# Patient Record
Sex: Male | Born: 1975
Health system: Southern US, Community
[De-identification: ages and names within clinical notes are randomized; demographics above are authoritative.]

## PROBLEM LIST (undated history)

## (undated) DIAGNOSIS — E119 Type 2 diabetes mellitus without complications: Secondary | ICD-10-CM

## (undated) DIAGNOSIS — E7800 Pure hypercholesterolemia, unspecified: Secondary | ICD-10-CM

## (undated) DIAGNOSIS — K219 Gastro-esophageal reflux disease without esophagitis: Secondary | ICD-10-CM

## (undated) DIAGNOSIS — I2699 Other pulmonary embolism without acute cor pulmonale: Secondary | ICD-10-CM

## (undated) DIAGNOSIS — I82409 Acute embolism and thrombosis of unspecified deep veins of unspecified lower extremity: Secondary | ICD-10-CM

## (undated) DIAGNOSIS — A0472 Enterocolitis due to Clostridium difficile, not specified as recurrent: Secondary | ICD-10-CM

## (undated) DIAGNOSIS — I10 Essential (primary) hypertension: Secondary | ICD-10-CM

## (undated) DIAGNOSIS — K859 Acute pancreatitis without necrosis or infection, unspecified: Secondary | ICD-10-CM

## (undated) HISTORY — PX: URETHRA SURGERY: SHX824

## (undated) HISTORY — PX: CHOLECYSTECTOMY: SHX55

---

## 2004-08-18 ENCOUNTER — Emergency Department (HOSPITAL_COMMUNITY): Admission: EM | Admit: 2004-08-18 | Discharge: 2004-08-19 | Payer: Self-pay | Admitting: Emergency Medicine

## 2007-10-02 ENCOUNTER — Ambulatory Visit: Payer: Self-pay | Admitting: Occupational Medicine

## 2008-01-28 ENCOUNTER — Ambulatory Visit: Payer: Self-pay | Admitting: Family Medicine

## 2008-01-28 DIAGNOSIS — J029 Acute pharyngitis, unspecified: Secondary | ICD-10-CM

## 2008-01-28 DIAGNOSIS — K219 Gastro-esophageal reflux disease without esophagitis: Secondary | ICD-10-CM

## 2008-01-28 DIAGNOSIS — J069 Acute upper respiratory infection, unspecified: Secondary | ICD-10-CM | POA: Insufficient documentation

## 2008-01-28 LAB — CONVERTED CEMR LAB: Rapid Strep: NEGATIVE

## 2008-02-21 ENCOUNTER — Ambulatory Visit: Payer: Self-pay | Admitting: Family Medicine

## 2008-02-21 DIAGNOSIS — R197 Diarrhea, unspecified: Secondary | ICD-10-CM

## 2008-02-21 DIAGNOSIS — R110 Nausea: Secondary | ICD-10-CM | POA: Insufficient documentation

## 2010-03-12 ENCOUNTER — Emergency Department (HOSPITAL_COMMUNITY)
Admission: EM | Admit: 2010-03-12 | Discharge: 2010-03-13 | Disposition: A | Discharge status: Home / Self Care | Payer: BC Managed Care – PPO | Attending: Emergency Medicine | Admitting: Emergency Medicine

## 2010-03-12 DIAGNOSIS — R05 Cough: Secondary | ICD-10-CM | POA: Insufficient documentation

## 2010-03-12 DIAGNOSIS — R059 Cough, unspecified: Secondary | ICD-10-CM | POA: Insufficient documentation

## 2010-03-12 DIAGNOSIS — R5383 Other fatigue: Secondary | ICD-10-CM | POA: Insufficient documentation

## 2010-03-12 DIAGNOSIS — J40 Bronchitis, not specified as acute or chronic: Secondary | ICD-10-CM | POA: Insufficient documentation

## 2010-03-12 DIAGNOSIS — R112 Nausea with vomiting, unspecified: Secondary | ICD-10-CM | POA: Insufficient documentation

## 2010-03-12 DIAGNOSIS — R51 Headache: Secondary | ICD-10-CM | POA: Insufficient documentation

## 2010-03-12 DIAGNOSIS — H9209 Otalgia, unspecified ear: Secondary | ICD-10-CM | POA: Insufficient documentation

## 2010-03-12 DIAGNOSIS — R6883 Chills (without fever): Secondary | ICD-10-CM | POA: Insufficient documentation

## 2010-03-12 DIAGNOSIS — R079 Chest pain, unspecified: Secondary | ICD-10-CM | POA: Insufficient documentation

## 2010-03-12 DIAGNOSIS — J3489 Other specified disorders of nose and nasal sinuses: Secondary | ICD-10-CM | POA: Insufficient documentation

## 2010-03-12 DIAGNOSIS — R5381 Other malaise: Secondary | ICD-10-CM | POA: Insufficient documentation

## 2010-03-12 LAB — RAPID STREP SCREEN (MED CTR MEBANE ONLY): Streptococcus, Group A Screen (Direct): NEGATIVE

## 2018-02-22 ENCOUNTER — Emergency Department (HOSPITAL_BASED_OUTPATIENT_CLINIC_OR_DEPARTMENT_OTHER): Payer: BLUE CROSS/BLUE SHIELD

## 2018-02-22 ENCOUNTER — Other Ambulatory Visit: Payer: Self-pay

## 2018-02-22 ENCOUNTER — Inpatient Hospital Stay (HOSPITAL_BASED_OUTPATIENT_CLINIC_OR_DEPARTMENT_OTHER)
Admission: EM | Admit: 2018-02-22 | Discharge: 2018-02-24 | DRG: 176 | Disposition: A | Discharge status: Home / Self Care | Payer: BLUE CROSS/BLUE SHIELD | Attending: Family Medicine | Admitting: Family Medicine

## 2018-02-22 ENCOUNTER — Encounter (HOSPITAL_BASED_OUTPATIENT_CLINIC_OR_DEPARTMENT_OTHER): Payer: Self-pay

## 2018-02-22 DIAGNOSIS — I2699 Other pulmonary embolism without acute cor pulmonale: Secondary | ICD-10-CM | POA: Diagnosis not present

## 2018-02-22 DIAGNOSIS — M79604 Pain in right leg: Secondary | ICD-10-CM

## 2018-02-22 DIAGNOSIS — Z8711 Personal history of peptic ulcer disease: Secondary | ICD-10-CM

## 2018-02-22 DIAGNOSIS — I82409 Acute embolism and thrombosis of unspecified deep veins of unspecified lower extremity: Secondary | ICD-10-CM | POA: Diagnosis present

## 2018-02-22 DIAGNOSIS — Z880 Allergy status to penicillin: Secondary | ICD-10-CM

## 2018-02-22 DIAGNOSIS — K219 Gastro-esophageal reflux disease without esophagitis: Secondary | ICD-10-CM | POA: Diagnosis present

## 2018-02-22 DIAGNOSIS — I82401 Acute embolism and thrombosis of unspecified deep veins of right lower extremity: Secondary | ICD-10-CM | POA: Diagnosis present

## 2018-02-22 DIAGNOSIS — R0683 Snoring: Secondary | ICD-10-CM | POA: Diagnosis not present

## 2018-02-22 LAB — BASIC METABOLIC PANEL
Anion gap: 8 (ref 5–15)
BUN: 18 mg/dL (ref 6–20)
CO2: 26 mmol/L (ref 22–32)
Calcium: 9.4 mg/dL (ref 8.9–10.3)
Chloride: 102 mmol/L (ref 98–111)
Creatinine, Ser: 1.16 mg/dL (ref 0.61–1.24)
GFR calc Af Amer: >60 mL/min (ref >60)
GFR calc non Af Amer: >60 mL/min (ref >60)
Glucose, Bld: 109 mg/dL — ABNORMAL HIGH (ref 70–99)
POTASSIUM: 4 mmol/L (ref 3.5–5.1)
Sodium: 136 mmol/L (ref 135–145)

## 2018-02-22 LAB — CBC WITH DIFFERENTIAL/PLATELET
Abs Immature Granulocytes: 0.03 10*3/uL (ref 0.00–0.07)
BASOS PCT: 1 %
Basophils Absolute: 0.1 10*3/uL (ref 0.0–0.1)
Eosinophils Absolute: 0.2 10*3/uL (ref 0.0–0.5)
Eosinophils Relative: 3 %
HCT: 44.3 % (ref 39.0–52.0)
Hemoglobin: 14.4 g/dL (ref 13.0–17.0)
Immature Granulocytes: 1 %
Lymphocytes Relative: 30 %
Lymphs Abs: 1.9 10*3/uL (ref 0.7–4.0)
MCH: 29.1 pg (ref 26.0–34.0)
MCHC: 32.5 g/dL (ref 30.0–36.0)
MCV: 89.5 fL (ref 80.0–100.0)
Monocytes Absolute: 0.6 10*3/uL (ref 0.1–1.0)
Monocytes Relative: 10 %
Neutro Abs: 3.7 10*3/uL (ref 1.7–7.7)
Neutrophils Relative %: 55 %
PLATELETS: 211 10*3/uL (ref 150–400)
RBC: 4.95 MIL/uL (ref 4.22–5.81)
RDW: 12.3 % (ref 11.5–15.5)
WBC: 6.5 10*3/uL (ref 4.0–10.5)
nRBC: 0 % (ref 0.0–0.2)

## 2018-02-22 LAB — D-DIMER, QUANTITATIVE: D-Dimer, Quant: 2.24 ug/mL-FEU — ABNORMAL HIGH (ref 0.00–0.50)

## 2018-02-22 LAB — TROPONIN I: Troponin I: <0.03 ng/mL (ref <0.03)

## 2018-02-22 LAB — GROUP A STREP BY PCR: GROUP A STREP BY PCR: NOT DETECTED

## 2018-02-22 MED ORDER — KETOROLAC TROMETHAMINE 30 MG/ML IJ SOLN
30.0000 mg | Freq: Once | INTRAMUSCULAR | Status: AC
  Administered 2018-02-22: 30 mg via INTRAVENOUS
  Filled 2018-02-22: qty 1

## 2018-02-22 MED ORDER — HEPARIN (PORCINE) 25000 UT/250ML-% IV SOLN
1700.0000 [IU]/h | INTRAVENOUS | Status: AC
  Administered 2018-02-22 – 2018-02-23 (×2): 1700 [IU]/h via INTRAVENOUS
  Filled 2018-02-22 (×2): qty 250

## 2018-02-22 MED ORDER — IOPAMIDOL (ISOVUE-370) INJECTION 76%
100.0000 mL | Freq: Once | INTRAVENOUS | Status: AC | PRN
  Administered 2018-02-22: 100 mL via INTRAVENOUS

## 2018-02-22 MED ORDER — HEPARIN BOLUS VIA INFUSION
5000.0000 [IU] | Freq: Once | INTRAVENOUS | Status: AC
  Administered 2018-02-22: 5000 [IU] via INTRAVENOUS

## 2018-02-22 NOTE — ED Provider Notes (Signed)
[facility] EMERGENCY DEPARTMENT Provider Note   CSN: [id number] Arrival date & time: 02/22/18  1726     History   Chief Complaint Chief Complaint  Patient presents with  . Cough    HPI Dakota Compton is a 43 y.o. male status post urethral surgery on 01/30/18 who presents for evaluation of 6 days of shortness of breath and chest pain.  He also reports he has had sore throat for the last 2 days.  Patient states that for the last 6 days, he has noticed he has had to "breathe heavier than normal."  Dates when he is at rest, he feels like he has to work to get a good deep breath in.  He states that he has noticed that his shortness of breath is worse on exertion.  He states that he tried to go wash his car and states that he had to stop halfway between because he could not breathe.  He also reports he has had some intermittent chest pain.  He states that midsternal chest pain is slightly worse with deep inspiration.  No exertional chest pain.  He denies any pressure, associated diaphoresis, nausea/vomiting.  Patient also reports that he feels like his shortness of breath is worse at night.  He states that at night, when he lays down he feels like he has burning pain that causes him to wake up.  Additionally, he feels like he cannot breathe.  He also reports he has had some mild cough that is worse at night.  He does report a history of GERD and states that he takes medication but takes it in the morning.  Patient also reports that about 2 days ago, he started developing a sore throat.  He states it feels similar to when he was extubated.  He states he has been able to tolerate his secretions and p.o. without any difficulty.  Patient states that he has not had any fevers, congestion, rhinorrhea, nausea/vomiting.  He does report that 2 members of his household are sick with flu.  The history is provided by the patient.    History reviewed. No pertinent past medical history.  Patient  Active Problem List   Diagnosis Date Noted  . NAUSEA ALONE 02/21/2008  . DIARRHEA 02/21/2008  . ACUTE PHARYNGITIS 01/28/2008  . URI 01/28/2008  . GERD 01/28/2008    Past Surgical History:  Procedure Laterality Date  . URETHRA SURGERY          Home Medications    Prior to Admission medications   Not on File    Family History No family history on file.  Social History Social History   Tobacco Use  . Smoking status: Never Smoker  . Smokeless tobacco: Never Used  Substance Use Topics  . Alcohol use: Never    Frequency: Never  . Drug use: Never     Allergies   Penicillins   Review of Systems Review of Systems  Constitutional: Negative for fever.  HENT: Positive for sore throat. Negative for rhinorrhea.   Respiratory: Positive for cough and shortness of breath.   Cardiovascular: Positive for chest pain. Negative for leg swelling.  Gastrointestinal: Negative for abdominal pain, nausea and vomiting.  Genitourinary: Negative for dysuria and hematuria.  Neurological: Negative for headaches.  All other systems reviewed and are negative.    Physical Exam Updated Vital Signs BP 127/84   Pulse 81   Temp 97.9 F (36.6 C) (Oral)   Resp 17   Ht 6'  4" (1.93 m)   Wt 122.5 kg   SpO2 97%   BMI 32.87 kg/m   Physical Exam Vitals signs and nursing note reviewed.  Constitutional:      Appearance: Normal appearance. He is well-developed.  HENT:     Head: Normocephalic and atraumatic.     Mouth/Throat:     Pharynx: Posterior oropharyngeal erythema present.     Comments: Steer oropharynx is erythematous.  No exudates, edema.  Uvula is midline. Airway is patent, phonation is intact. Eyes:     General: Lids are normal.     Conjunctiva/sclera: Conjunctivae normal.     Pupils: Pupils are equal, round, and reactive to light.  Neck:     Musculoskeletal: Full passive range of motion without pain.  Cardiovascular:     Rate and Rhythm: Normal rate and regular rhythm.       Pulses: Normal pulses.     Heart sounds: Normal heart sounds. No murmur. No friction rub. No gallop.   Pulmonary:     Effort: Pulmonary effort is normal.     Breath sounds: Normal breath sounds.     Comments: Lungs clear to auscultation bilaterally.  Symmetric chest rise.  No wheezing, rales, rhonchi.  Will speak in full sentences without any difficulty. Chest:     Chest wall: Tenderness present.     Comments: Tenderness to palpation noted to midsternal chest area.  No deformity or crepitus noted. Abdominal:     Palpations: Abdomen is soft. Abdomen is not rigid.     Tenderness: There is no abdominal tenderness. There is no guarding.  Musculoskeletal: Normal range of motion.     Comments: Tenderness palpation in his right calf.  Right calf is slightly dry than left lower extremity.  No overlying warmth, erythema.  Skin:    General: Skin is warm and dry.     Capillary Refill: Capillary refill takes less than 2 seconds.  Neurological:     Mental Status: He is alert and oriented to person, place, and time.  Psychiatric:        Speech: Speech normal.      ED Treatments / Results  Labs (all labs ordered are listed, but only abnormal results are displayed) Labs Reviewed  D-DIMER, QUANTITATIVE (NOT AT [facility]) - Abnormal; Notable for the following components:      Result Value   D-Dimer, Quant 2.24 (*)    All other components within normal limits  BASIC METABOLIC PANEL - Abnormal; Notable for the following components:   Glucose, Bld 109 (*)    All other components within normal limits  GROUP A STREP BY PCR  TROPONIN I  CBC WITH DIFFERENTIAL/PLATELET  INFLUENZA PANEL BY PCR (TYPE A & B)    EKG None  Radiology Dg Chest 2 View  Result Date: 02/22/2018 CLINICAL DATA:  Shortness of breath EXAM: CHEST - 2 VIEW COMPARISON:  None. FINDINGS: The heart size and mediastinal contours are within normal limits. Both lungs are clear. The visualized skeletal structures are unremarkable.  IMPRESSION: No active cardiopulmonary disease. Electronically Signed   By: Deatra Robinson M.D.   On: 02/22/2018 19:17    Procedures Procedures (including critical care time)  Medications Ordered in ED Medications  iopamidol (ISOVUE-370) 76 % injection 100 mL (has no administration in time range)  ketorolac (TORADOL) 30 MG/ML injection 30 mg (30 mg Intravenous Given 02/22/18 1956)     Initial Impression / Assessment and Plan / ED Course  I have reviewed the triage vital signs  and the nursing notes.  Pertinent labs & imaging results that were available during my care of the patient were reviewed by me and considered in my medical decision making (see chart for details).     43 year old male who presents for evaluation of 6 days of dyspnea on exertion, pleuritic chest pain.  He reports he is status post urological surgery on 01/30/18.  He does report that family members at home have been sick with influenza symptoms but he denies any fevers, nasal congestion.  He does report he has had some sore throat x2 days.  Also endorsing some right calf pain.  Patient is afebrile, non-toxic appearing, sitting comfortably on examination table. Vital signs reviewed and stable.  On exam, lungs clear to auscultation.  No evidence of wheezing.  Patient able speak in full sentences without difficulty.  Right lower extremity slightly larger than left lower extremity but no overlying warmth, erythema, edema.  Given risk factors, will plan for d-dimer evaluation for possible PE.  Additionally, consider ACS etiology though it sounds like atypical presentation.  Consider infectious etiology.  We will plan to check labs, chest x-ray, EKG.  CBC shows no evidence of leukocytosis or anemia.  BMP is unremarkable.  Troponin negative.  Strep is negative.  Chest x-ray negative for any acute infectious etiology.  D-dimer is positive at 2.24.  We will plan to proceed with CTA.  Signed out to Tonny BollmanAbby Harris, PA-C CTA, ultrasound of  right lower extremity.  Final Clinical Impressions(s) / ED Diagnoses   Final diagnoses:  None    ED Discharge Orders    None       Rosana HoesLayden, Nessie Nong A, PA-C 02/22/18 1959    Charlynne PanderYao, David Hsienta, MD 02/24/18 [phone]

## 2018-02-22 NOTE — ED Notes (Signed)
Called pharmacy to verify heparin dosing

## 2018-02-22 NOTE — ED Triage Notes (Signed)
Pt c/o flu like sx with SOB-states he had urologic surgery 01/30/18 and was intubated-NAD-steady gait

## 2018-02-22 NOTE — ED Provider Notes (Signed)
Assumed care of the patient from PA Layden Here for eDOE and pleuritic cp s/p surgery in January ? pe versus Gerd. CTA  Ordered.  CT angios resulted with bilateral pulmonary emboli.  His right lower extremity also shows acute DVT.  Patient started on heparin VTE treatment.  He will be admitted to the hospital.  Stable throughout his ER stay.  CRITICAL CARE Performed by: Arthor Captain Total critical care time: 40 minutes Critical care time was exclusive of separately billable procedures and treating other patients. Critical care was necessary to treat or prevent imminent or life-threatening deterioration. Critical care was time spent personally by me on the following activities: development of treatment plan with patient and/or surrogate as well as nursing, discussions with consultants, evaluation of patient's response to treatment, examination of patient, obtaining history from patient or surrogate, ordering and performing treatments and interventions, ordering and review of laboratory studies, ordering and review of radiographic studies, pulse oximetry and re-evaluation of patient's condition.    Arthor Captain, PA-C 02/24/18 0008    Charlynne Pander, MD 02/24/18 [phone]

## 2018-02-22 NOTE — ED Notes (Signed)
Pt returned from imaging.

## 2018-02-22 NOTE — ED Notes (Addendum)
Pt informed nurse that his right calf has been hurting a lot recently, and that since his surgery, he developed varicose veins on his knee. EDP made aware

## 2018-02-22 NOTE — Progress Notes (Signed)
ANTICOAGULATION CONSULT NOTE - Initial Consult  Pharmacy Consult for heparin Indication: pulmonary embolus  Patient Measurements: Height: 6\' 4"  (193 cm) Weight: 270 lb (122.5 kg) IBW/kg (Calculated) : 86.8 Heparin Dosing Weight: 112.7 kg  Vital Signs: Temp: 97.9 F (36.6 C) (01/30 1740) Temp Source: Oral (01/30 1740) BP: 133/92 (01/30 2135) Pulse Rate: 75 (01/30 2135)  Labs: Recent Labs    02/22/18 1827  HGB 14.4  HCT 44.3  PLT 211  CREATININE 1.16  TROPONINI <0.03    Assessment: 43 yo male with SOB and chest pain. Had surgery earlier this month. CTA today shows bilateral PE with RV/LV ratio < 1. Planning to initiate heparin infusion. CBC wnl, SCr 1.1.    Goal of Therapy:  Heparin level 0.3-0.7 units/ml Monitor platelets by anticoagulation protocol: Yes    Plan:  -Heparin 5000 units x1 then 1700 units/hr -Daily HL, CBC -First level tomorrow morning   Baldemar Friday 02/22/2018,9:56 PM

## 2018-02-23 ENCOUNTER — Encounter (HOSPITAL_COMMUNITY): Payer: Self-pay

## 2018-02-23 ENCOUNTER — Observation Stay (HOSPITAL_COMMUNITY): Payer: BLUE CROSS/BLUE SHIELD

## 2018-02-23 DIAGNOSIS — I2699 Other pulmonary embolism without acute cor pulmonale: Secondary | ICD-10-CM | POA: Diagnosis not present

## 2018-02-23 DIAGNOSIS — I82409 Acute embolism and thrombosis of unspecified deep veins of unspecified lower extremity: Secondary | ICD-10-CM | POA: Diagnosis present

## 2018-02-23 DIAGNOSIS — M79604 Pain in right leg: Secondary | ICD-10-CM | POA: Diagnosis present

## 2018-02-23 DIAGNOSIS — R0683 Snoring: Secondary | ICD-10-CM | POA: Diagnosis not present

## 2018-02-23 DIAGNOSIS — Z880 Allergy status to penicillin: Secondary | ICD-10-CM | POA: Diagnosis not present

## 2018-02-23 DIAGNOSIS — I82411 Acute embolism and thrombosis of right femoral vein: Secondary | ICD-10-CM | POA: Diagnosis not present

## 2018-02-23 DIAGNOSIS — K219 Gastro-esophageal reflux disease without esophagitis: Secondary | ICD-10-CM | POA: Diagnosis not present

## 2018-02-23 DIAGNOSIS — Z8711 Personal history of peptic ulcer disease: Secondary | ICD-10-CM | POA: Diagnosis not present

## 2018-02-23 DIAGNOSIS — I82401 Acute embolism and thrombosis of unspecified deep veins of right lower extremity: Secondary | ICD-10-CM | POA: Diagnosis not present

## 2018-02-23 LAB — ANTITHROMBIN III: AntiThromb III Func: 93 % (ref 75–120)

## 2018-02-23 LAB — CBC
HCT: 42.9 % (ref 39.0–52.0)
Hemoglobin: 13.8 g/dL (ref 13.0–17.0)
MCH: 28.4 pg (ref 26.0–34.0)
MCHC: 32.2 g/dL (ref 30.0–36.0)
MCV: 88.3 fL (ref 80.0–100.0)
PLATELETS: 192 10*3/uL (ref 150–400)
RBC: 4.86 MIL/uL (ref 4.22–5.81)
RDW: 12.3 % (ref 11.5–15.5)
WBC: 6.4 10*3/uL (ref 4.0–10.5)
nRBC: 0 % (ref 0.0–0.2)

## 2018-02-23 LAB — INFLUENZA PANEL BY PCR (TYPE A & B)
INFLAPCR: NEGATIVE
Influenza B By PCR: NEGATIVE

## 2018-02-23 LAB — TROPONIN I
Troponin I: <0.03 ng/mL (ref <0.03)
Troponin I: <0.03 ng/mL (ref <0.03)

## 2018-02-23 LAB — ECHOCARDIOGRAM COMPLETE
Height: 76 in
WEIGHTICAEL: 4172.87 [oz_av]

## 2018-02-23 LAB — HEPARIN LEVEL (UNFRACTIONATED)
Heparin Unfractionated: 0.41 IU/mL (ref 0.30–0.70)
Heparin Unfractionated: 0.44 IU/mL (ref 0.30–0.70)

## 2018-02-23 MED ORDER — PHENOL 1.4 % MT LIQD: 1.0000 | OROMUCOSAL | Status: DC | PRN

## 2018-02-23 MED ORDER — RIVAROXABAN 15 MG PO TABS
15.0000 mg | ORAL_TABLET | Freq: Two times a day (BID) | ORAL | Status: DC
  Administered 2018-02-23 – 2018-02-24 (×2): 15 mg via ORAL
  Filled 2018-02-23 (×2): qty 1

## 2018-02-23 MED ORDER — RIVAROXABAN 15 MG PO TABS: 15.0000 mg | ORAL_TABLET | Freq: Two times a day (BID) | ORAL | Status: DC

## 2018-02-23 MED ORDER — ACETAMINOPHEN 325 MG PO TABS
650.0000 mg | ORAL_TABLET | Freq: Four times a day (QID) | ORAL | Status: DC | PRN
  Administered 2018-02-23: 650 mg via ORAL
  Filled 2018-02-23: qty 2

## 2018-02-23 MED ORDER — SODIUM CHLORIDE 0.9% FLUSH: 3.0000 mL | INTRAVENOUS | Status: DC | PRN

## 2018-02-23 MED ORDER — SODIUM CHLORIDE 0.9% FLUSH
3.0000 mL | Freq: Two times a day (BID) | INTRAVENOUS | Status: DC
  Administered 2018-02-23 (×2): 3 mL via INTRAVENOUS

## 2018-02-23 MED ORDER — GUAIFENESIN-DM 100-10 MG/5ML PO SYRP
5.0000 mL | ORAL_SOLUTION | ORAL | Status: DC | PRN
  Administered 2018-02-23 (×2): 5 mL via ORAL
  Filled 2018-02-23 (×2): qty 5

## 2018-02-23 MED ORDER — ACETAMINOPHEN 650 MG RE SUPP: 650.0000 mg | Freq: Four times a day (QID) | RECTAL | Status: DC | PRN

## 2018-02-23 MED ORDER — RIVAROXABAN (XARELTO) VTE STARTER PACK (15 & 20 MG): ORAL_TABLET | ORAL | 0 refills | Status: DC

## 2018-02-23 MED ORDER — SODIUM CHLORIDE 0.9 % IV SOLN: 250.0000 mL | INTRAVENOUS | Status: DC | PRN

## 2018-02-23 MED ORDER — MENTHOL 3 MG MT LOZG
1.0000 | LOZENGE | OROMUCOSAL | Status: DC | PRN
  Administered 2018-02-23: 3 mg via ORAL
  Filled 2018-02-23: qty 9

## 2018-02-23 MED FILL — XARELTO STARTER PACK: 15 & 20 | 30 days supply | Qty: 51 | Fill #0

## 2018-02-23 NOTE — Progress Notes (Signed)
PROGRESS NOTE  LORRIN PINSKY TRV:[id number] DOB: 10/27/1975 DOA: 02/22/2018 [facility]CP: [facility]  HPI/Brief Narrative  Dakota Compton is a 43 y.o. year old male with medical history significant for GERD (on Protonix) who presented on 02/22/2018 with SOB and right leg cramping and was admitted for further evaluation of RLE DVT and bilateral PE. 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 He states for the first five days after the procedure he was not very mobile while recovering, mostly laying in bed all day. He then was doing fine, but started noticing some pain/tightness in his right calf when he was returned to his urologist on 1/21 to have his catheter removed. He then noticed he started experiencing SOB about 1 week ago, which occurs sometimes at rest and worsens with activity. He reports associated chest burning/tightness that occurs occasionally when he experiences SOB. He denies any fever or hemoptysis. He denies any hx of DVT or PE.   ED course: The patient vitals were stable in the ED. He did not require any O2. CBC did not show any evidence of leukocytosis or anemia. D-dimer was found to be elevated. CTA chest was then ordered, which demonstrated bilateral lower lobe pulmonary emboli with moderate clot burden and slight flattening on LV that suggests mild right heart strain. Doppler US of RLE demonstrated partially occlusive thrombus noted in the distal right femoral vein. He was started on Heparin drip.   Subjective Patient states he is still experiencing SOB today that is about the same as yesterday. He also is still experiencing a sore throat. He denies any CP or palpitations. He denies any nausea, vomiting, or abdominal pain. He denies any dysuria or hematuria.  Assessment/Plan:  1. Acute Pulmonary Embolism- CTA chest demonstrated bilateral lower lobe pulmonary emboli. Likely due to recent urethroplasty on 1/7 along with sedentary lifestyle  initially while recovering after the procedure. Patient has been started on Heparin drip. His Troponin has been negative x 2. Will continue to trend. His CTA chest did demonstrate evidence of possible right heart strain. Echo was performed today demonstrated normal systolic function of 60-65% and normal diastolic filling patterns. A Hypercoaguable work up has been drawn, but drawn after Heparin was already started. His vitals continue to be stable today. No tachycardia or hypotension. His O2 sat is stable on room air. Will continue Heparin drip with the plan to transition to PO anticoagulation later today or tomorrow.   2. DVT of RLE- Patient has been started on Heparin drip with plan to likely transition to PO anticoagulation later today or tomorrow. See #1 for details.   3. GERD w/ hx of gastric ulcers- Stable. Patient states he takes Protonix for his GERD. He does have hx of gastric ulcers a few years ago. No complaints of recent melena or hematemesis. Discussed with patient that with being on blood thinners it is important that he monitors his stool for any blood or black tarry stools. In the case that he notices any melena, he should present to the ER due to the fact that he is being started on a blood thinner.    4. Recent urethroplasty for urethral stricture on 01/30/18- Patient had foley catheter removed by his urologist on 02/13/18. He states he has been urinating without difficulty and is no longer experiencing any dysuria. Denies any hematuria. He has scheduled follow-up with his urologist, Dr. Lindley Magnus at [facility].   5. Sore throat- Patient continues to complain of sore throat.  There was concern of flu initially since he has some family members at home with the flu. His Influenza test was negative here. CBC did not show any evidence of leukocytosis. He is afebrile. Will continue Tylenol and Chloraseptic PRN.   Cultures:  none  Telemetry: Patient is on Telemetry   DVT prophylaxis:  Heparin  Consultants:  none    Procedures:  Echo performed 02/23/18   Code Status: FULL   Family Communication: None at bedside    Disposition Plan: Anticipate discharge home tomorrow with continued clinical improvement and patient being transitioned from Heparin to oral anticoagulation.   Objective: Vitals:   02/23/18 0030 02/23/18 0200 02/23/18 0259 02/23/18 0736  BP: 130/82  123/86 124/85  Pulse: 80  74 77  Resp: 16  16 18   Temp:   97.9 F (36.6 C) (!) 97.5 F (36.4 C)  TempSrc:   Oral Oral  SpO2: 98%  99% 95%  Weight:  118.3 kg    Height:  6\' 4"  (1.93 m)      Intake/Output Summary (Last 24 hours) at 02/23/2018 1219 Last data filed at 02/23/2018 0998 Gross per 24 hour  Intake 129 ml  Output 350 ml  Net -221 ml   Filed Weights   02/22/18 1739 02/23/18 0200  Weight: 122.5 kg 118.3 kg    Exam: Constitutional:normal appearing male in NAD Eyes: anicteric, normal conjunctivae ENMT: Oropharynx with moist mucous membranes, normal dentition Neck: FROM Cardiovascular: RRR no MRGs, no pitting edema, DP and PT pulses intact  Respiratory: Normal respiratory effort, clear breath sounds, no wheezing, no accessory muscle use.  Abdomen: Soft, non-tender, with no HSM Skin: No rash ulcers, or lesions.  Extremities: Able to move all extremities. Some tenderness to palpation of his right calf. Right calf appears slightly larger than left calf. No evidence of any erythema or warmth on BLE.  Neurologic: Grossly no focal neuro deficit. Psychiatric:Appropriate affect, and mood. Mental status AAOx3  Data Reviewed: CBC: Recent Labs  Lab 02/22/18 1827 02/23/18 0514  WBC 6.5 6.4  NEUTROABS 3.7  --   HGB 14.4 13.8  HCT 44.3 42.9  MCV 89.5 88.3  PLT 211 192   Basic Metabolic Panel: Recent Labs  Lab 02/22/18 1827  NA 136  K 4.0  CL 102  CO2 26  GLUCOSE 109*  BUN 18  CREATININE 1.16  CALCIUM 9.4   GFR: Estimated Creatinine Clearance: 115.4 mL/min (by C-G formula  based on SCr of 1.16 mg/dL). Liver Function Tests: No results for input(s): AST, ALT, ALKPHOS, BILITOT, PROT, ALBUMIN in the last 168 hours. No results for input(s): LIPASE, AMYLASE in the last 168 hours. No results for input(s): AMMONIA in the last 168 hours. Coagulation Profile: No results for input(s): INR, PROTIME in the last 168 hours. Cardiac Enzymes: Recent Labs  Lab 02/22/18 1827 02/23/18 0514  TROPONINI <0.03 <0.03   BNP (last 3 results) No results for input(s): PROBNP in the last 8760 hours. HbA1C: No results for input(s): HGBA1C in the last 72 hours. CBG: No results for input(s): GLUCAP in the last 168 hours. Lipid Profile: No results for input(s): CHOL, HDL, LDLCALC, TRIG, CHOLHDL, LDLDIRECT in the last 72 hours. Thyroid Function Tests: No results for input(s): TSH, T4TOTAL, FREET4, T3FREE, THYROIDAB in the last 72 hours. Anemia Panel: No results for input(s): VITAMINB12, FOLATE, FERRITIN, TIBC, IRON, RETICCTPCT in the last 72 hours. Urine analysis: No results found for: COLORURINE, APPEARANCEUR, LABSPEC, PHURINE, GLUCOSEU, HGBUR, BILIRUBINUR, KETONESUR, PROTEINUR, UROBILINOGEN, NITRITE, LEUKOCYTESUR Sepsis Labs: @LABRCNTIP (procalcitonin:4,lacticidven:4)  )  Recent Results (from the past 240 hour(s))  Group A Strep by PCR     Status: None   Collection Time: 02/22/18  6:27 PM  Result Value Ref Range Status   Group A Strep by PCR NOT DETECTED NOT DETECTED Final    Comment: Performed at [facility], [address] [place], [place] [address]      Studies: Dg Chest 2 View  Result Date: 02/22/2018 CLINICAL DATA:  Shortness of breath EXAM: CHEST - 2 VIEW COMPARISON:  None. FINDINGS: The heart size and mediastinal contours are within normal limits. Both lungs are clear. The visualized skeletal structures are unremarkable. IMPRESSION: No active cardiopulmonary disease. Electronically Signed   By: Deatra RobinsonKevin  Herman M.D.   On: 02/22/2018 19:17   Ct Angio Chest  Pe W And/or Wo Contrast  Result Date: 02/22/2018 CLINICAL DATA:  Chest pain and shortness of breath for 1 week. History of recent urological surgery. Right calf pain. Positive D-dimer. EXAM: CT ANGIOGRAPHY CHEST WITH CONTRAST TECHNIQUE: Multidetector CT imaging of the chest was performed using the standard protocol during bolus administration of intravenous contrast. Multiplanar CT image reconstructions and MIPs were obtained to evaluate the vascular anatomy. CONTRAST:  100mL ISOVUE-370 IOPAMIDOL (ISOVUE-370) INJECTION 76% COMPARISON:  None. FINDINGS: Cardiovascular: The heart is normal in size. No pericardial effusion. The aorta is normal in caliber. No dissection. The branch vessels are patent. No coronary artery calcifications are demonstrated. The pulmonary arterial tree is fairly well opacified. There are bilateral lower lobe pulmonary artery filling defects consistent with pulmonary embolism. Moderate appearing clot burden. There is slight flattening of the left ventricle which may suggest mild right heart strain but the RV LV ratio is less than 1. Mediastinum/Nodes: No mediastinal or hilar mass or lymphadenopathy. The esophagus is grossly normal. Lungs/Pleura: No acute pulmonary findings. No evidence of pulmonary infarct or infiltrate. No pleural effusion. Upper Abdomen: No significant upper abdominal findings. Fatty infiltration of the liver is noted. Musculoskeletal: No significant bony findings. Review of the MIP images confirms the above findings. IMPRESSION: 1. Bilateral lower lobe pulmonary emboli with moderate clot burden. Slight flattening of the left ventricle suggests mild right heart strain. However, the RV LV ratio is less than 1. 2. Normal appearance of the aorta. 3. No acute pulmonary findings. 4. Diffuse fatty infiltration of the liver. Electronically Signed   By: Rudie MeyerP.  Gallerani M.D.   On: 02/22/2018 21:14   [place] Venous Img Lower Unilateral Right  Result Date: 02/22/2018 CLINICAL DATA:   Right calf pain.  Pulmonary emboli. EXAM: RIGHT LOWER EXTREMITY VENOUS DOPPLER ULTRASOUND TECHNIQUE: Gray-scale sonography with graded compression, as well as color Doppler and duplex ultrasound were performed to evaluate the lower extremity deep venous systems from the level of the common femoral vein and including the common femoral, femoral, profunda femoral, popliteal and calf veins including the posterior tibial, peroneal and gastrocnemius veins when visible. The superficial great saphenous vein was also interrogated. Spectral Doppler was utilized to evaluate flow at rest and with distal augmentation maneuvers in the common femoral, femoral and popliteal veins. COMPARISON:  None. FINDINGS: Contralateral Common Femoral Vein: Respiratory phasicity is normal and symmetric with the symptomatic side. No evidence of thrombus. Normal compressibility. Common Femoral Vein: No evidence of thrombus. Normal compressibility, respiratory phasicity and response to augmentation. Saphenofemoral Junction: No evidence of thrombus. Normal compressibility and flow on color Doppler imaging. Profunda Femoral Vein: No evidence of thrombus. Normal compressibility and flow on color Doppler imaging. Femoral Vein: Partially occlusive  thrombus noted in the distal femoral vein. Popliteal Vein: No evidence of thrombus. Normal compressibility, respiratory phasicity and response to augmentation. Calf Veins: Not well visualized. Superficial Great Saphenous Vein: No evidence of thrombus. Normal compressibility. Venous Reflux:  None. Other Findings:  None. IMPRESSION: Partially occlusive thrombus noted in the distal right femoral vein. Electronically Signed   By: Charlett NoseKevin  Dover M.D.   On: 02/22/2018 21:44    Scheduled Meds: . sodium chloride flush  3 mL Intravenous Q12H    Continuous Infusions: . sodium chloride    . heparin 1,700 Units/hr (02/23/18 0926)     LOS: 0 days    Wilmon Armsatherine Fawnda Vitullo, PA-S

## 2018-02-23 NOTE — Care Management (Addendum)
#   3.  S / W  MEGIL @ [facility] #  [id number]   ELIQUIS  10 MG BID : NONE FORMULARY  1.  ELIQUIS  2.5 MG BID COVER- YES CO-PAY- ZERO DOLLARS TIER- NO PRIOR APPROVAL- NO  2. ELIQUIS 5 MG BID COVER- YES CO-PAY- ZERO DOLLARS TIER- NO PRIOR APPROVAL- NO  3. XARELTO 15 MG BID COVER- YES CO-PAY- ZERO DOLLARS TIER- NO PRIOR APPROVAL- NO  4. XARELTO  20 MG DAILY COVER- YES CO-PAY-  ZERO DOLLARS TIER-NO PRIOR  APPROVAL- NO  PREFERRED PHARMACY : YES - [facility]

## 2018-02-23 NOTE — Progress Notes (Signed)
PROGRESS NOTE    Dakota Compton  OZY:[id number] DOB: 12-10-1975 DOA: 02/22/2018 PCP: [facility]      Brief Narrative:  [name] is a 43 y.o. M with GERD who presents with 2 weeks of right leg pain, and now dyspnea on exertion.  The patient was in his usual state of health until earlier this month he underwent a urethral stricture dilation.  He was subsequently very sedentary, only getting up to use the bathroom for over a week.  During that time he developed some right calf and leg pain/cramps, and subsequent to that some dyspnea.  Finally when he developed some URI symptoms (nasal congestion and sore throat) he presented to the hospital.    In the ER, d-dimer noted to be elevated.  US dopplers showed RLE clot and CTA chest showed bilateral PE with RV strain pattern.  Started on heparin.     Assessment & Plan:  Acute provoked DVT/PE This is a first event.  He has no significant family history of VTE.  Troponins negative, but RV strain noted on CT angiogram. -Stop heparin, start Xarelto -Obtain echocardiogram -We will need follow-up hypercoagulable panel as an outpatient       DVT prophylaxis: N/A Code Status: FUL Family Communication: None present MDM and disposition Plan: This is a no charge note.  For further details, please see H&P by my partner Dr. Onalee Hua from earlier today.  The below labs and imaging reports were reviewed and summarized above.    The patient was admitted with new PE.    Objective: Vitals:   02/23/18 0030 02/23/18 0200 02/23/18 0259 02/23/18 0736  BP: 130/82  123/86 124/85  Pulse: 80  74 77  Resp: 16  16 18   Temp:   97.9 F (36.6 C) (!) 97.5 F (36.4 C)  TempSrc:   Oral Oral  SpO2: 98%  99% 95%  Weight:  118.3 kg    Height:  6\' 4"  (1.93 m)      Intake/Output Summary (Last 24 hours) at 02/23/2018 1514 Last data filed at 02/23/2018 0928 Gross per 24 hour  Intake 129 ml  Output 350 ml  Net -221 ml   Filed Weights   02/22/18 1739 02/23/18 0200  Weight: 122.5 kg 118.3 kg    Examination: The patient was seen and examined.      Data Reviewed: I have personally reviewed following labs and imaging studies:  CBC: Recent Labs  Lab 02/22/18 1827 02/23/18 0514  WBC 6.5 6.4  NEUTROABS 3.7  --   HGB 14.4 13.8  HCT 44.3 42.9  MCV 89.5 88.3  PLT 211 192   Basic Metabolic Panel: Recent Labs  Lab 02/22/18 1827  NA 136  K 4.0  CL 102  CO2 26  GLUCOSE 109*  BUN 18  CREATININE 1.16  CALCIUM 9.4   GFR: Estimated Creatinine Clearance: 115.4 mL/min (by C-G formula based on SCr of 1.16 mg/dL). Liver Function Tests: No results for input(s): AST, ALT, ALKPHOS, BILITOT, PROT, ALBUMIN in the last 168 hours. No results for input(s): LIPASE, AMYLASE in the last 168 hours. No results for input(s): AMMONIA in the last 168 hours. Coagulation Profile: No results for input(s): INR, PROTIME in the last 168 hours. Cardiac Enzymes: Recent Labs  Lab 02/22/18 1827 02/23/18 0514 02/23/18 1144  TROPONINI <0.03 <0.03 <0.03   BNP (last 3 results) No results for input(s): PROBNP in the last 8760 hours. HbA1C: No results for input(s): HGBA1C in the last 72 hours. CBG:  No results for input(s): GLUCAP in the last 168 hours. Lipid Profile: No results for input(s): CHOL, HDL, LDLCALC, TRIG, CHOLHDL, LDLDIRECT in the last 72 hours. Thyroid Function Tests: No results for input(s): TSH, T4TOTAL, FREET4, T3FREE, THYROIDAB in the last 72 hours. Anemia Panel: No results for input(s): VITAMINB12, FOLATE, FERRITIN, TIBC, IRON, RETICCTPCT in the last 72 hours. Urine analysis: No results found for: COLORURINE, APPEARANCEUR, LABSPEC, PHURINE, GLUCOSEU, HGBUR, BILIRUBINUR, KETONESUR, PROTEINUR, UROBILINOGEN, NITRITE, LEUKOCYTESUR Sepsis Labs: @LABRCNTIPBADTEXTTAG>$ (procalcitonin:4,lacticacidven:4)  ) Recent Results (from the past 240 hour(s))  Group A Strep by PCR     Status: None   Collection Time: 02/22/18  6:27 PM  Result  Value Ref Range Status   Group A Strep by PCR NOT DETECTED NOT DETECTED Final    Comment: Performed at [facility] [place], [address] [place], [place] [address]         Radiology Studies: Dg Chest 2 View  Result Date: 02/22/2018 CLINICAL DATA:  Shortness of breath EXAM: CHEST - 2 VIEW COMPARISON:  None. FINDINGS: The heart size and mediastinal contours are within normal limits. Both lungs are clear. The visualized skeletal structures are unremarkable. IMPRESSION: No active cardiopulmonary disease. Electronically Signed   By: Deatra RobinsonKevin  Herman M.D.   On: 02/22/2018 19:17   Ct Angio Chest Pe W And/or Wo Contrast  Result Date: 02/22/2018 CLINICAL DATA:  Chest pain and shortness of breath for 1 week. History of recent urological surgery. Right calf pain. Positive D-dimer. EXAM: CT ANGIOGRAPHY CHEST WITH CONTRAST TECHNIQUE: Multidetector CT imaging of the chest was performed using the standard protocol during bolus administration of intravenous contrast. Multiplanar CT image reconstructions and MIPs were obtained to evaluate the vascular anatomy. CONTRAST:  100mL ISOVUE-370 IOPAMIDOL (ISOVUE-370) INJECTION 76% COMPARISON:  None. FINDINGS: Cardiovascular: The heart is normal in size. No pericardial effusion. The aorta is normal in caliber. No dissection. The branch vessels are patent. No coronary artery calcifications are demonstrated. The pulmonary arterial tree is fairly well opacified. There are bilateral lower lobe pulmonary artery filling defects consistent with pulmonary embolism. Moderate appearing clot burden. There is slight flattening of the left ventricle which may suggest mild right heart strain but the RV LV ratio is less than 1. Mediastinum/Nodes: No mediastinal or hilar mass or lymphadenopathy. The esophagus is grossly normal. Lungs/Pleura: No acute pulmonary findings. No evidence of pulmonary infarct or infiltrate. No pleural effusion. Upper Abdomen: No significant upper abdominal  findings. Fatty infiltration of the liver is noted. Musculoskeletal: No significant bony findings. Review of the MIP images confirms the above findings. IMPRESSION: 1. Bilateral lower lobe pulmonary emboli with moderate clot burden. Slight flattening of the left ventricle suggests mild right heart strain. However, the RV LV ratio is less than 1. 2. Normal appearance of the aorta. 3. No acute pulmonary findings. 4. Diffuse fatty infiltration of the liver. Electronically Signed   By: Rudie MeyerP.  Gallerani M.D.   On: 02/22/2018 21:14   [place] Venous Img Lower Unilateral Right  Result Date: 02/22/2018 CLINICAL DATA:  Right calf pain.  Pulmonary emboli. EXAM: RIGHT LOWER EXTREMITY VENOUS DOPPLER ULTRASOUND TECHNIQUE: Gray-scale sonography with graded compression, as well as color Doppler and duplex ultrasound were performed to evaluate the lower extremity deep venous systems from the level of the common femoral vein and including the common femoral, femoral, profunda femoral, popliteal and calf veins including the posterior tibial, peroneal and gastrocnemius veins when visible. The superficial great saphenous vein was also interrogated. Spectral Doppler was utilized to evaluate  flow at rest and with distal augmentation maneuvers in the common femoral, femoral and popliteal veins. COMPARISON:  None. FINDINGS: Contralateral Common Femoral Vein: Respiratory phasicity is normal and symmetric with the symptomatic side. No evidence of thrombus. Normal compressibility. Common Femoral Vein: No evidence of thrombus. Normal compressibility, respiratory phasicity and response to augmentation. Saphenofemoral Junction: No evidence of thrombus. Normal compressibility and flow on color Doppler imaging. Profunda Femoral Vein: No evidence of thrombus. Normal compressibility and flow on color Doppler imaging. Femoral Vein: Partially occlusive thrombus noted in the distal femoral vein. Popliteal Vein: No evidence of thrombus. Normal  compressibility, respiratory phasicity and response to augmentation. Calf Veins: Not well visualized. Superficial Great Saphenous Vein: No evidence of thrombus. Normal compressibility. Venous Reflux:  None. Other Findings:  None. IMPRESSION: Partially occlusive thrombus noted in the distal right femoral vein. Electronically Signed   By: Charlett NoseKevin  Dover M.D.   On: 02/22/2018 21:44        Scheduled Meds: . Rivaroxaban  15 mg Oral BID WC  . sodium chloride flush  3 mL Intravenous Q12H   Continuous Infusions: . sodium chloride    . heparin 1,700 Units/hr (02/23/18 0926)     LOS: 0 days    Time spent: 25 minutes    Alberteen Samhristopher P Aarnav Steagall, MD [facility] 02/23/2018, 3:14 PM     Pager [phone] --- please page though AMION:  [email or web address] Password [email or web address] If 7PM-7AM, please contact night-coverage

## 2018-02-23 NOTE — Progress Notes (Signed)
ANTICOAGULATION CONSULT NOTE - follow-up Consult  Pharmacy Consult for Heparin Indication: pulmonary embolus and RLE DVT  Patient Measurements: Height: 6\' 4"  (193 cm) Weight: 260 lb 12.9 oz (118.3 kg) IBW/kg (Calculated) : 86.8 Heparin Dosing Weight: 113 kg  Vital Signs: Temp: 97.5 F (36.4 C) (01/31 0736) Temp Source: Oral (01/31 0736) BP: 124/85 (01/31 0736) Pulse Rate: 77 (01/31 0736)  Labs: Recent Labs    02/22/18 1827 02/23/18 0514 02/23/18 1144  HGB 14.4 13.8  --   HCT 44.3 42.9  --   PLT 211 192  --   HEPARINUNFRC  --  0.41 0.44  CREATININE 1.16  --   --   TROPONINI <0.03 <0.03 <0.03    Estimated Creatinine Clearance: 115.4 mL/min (by C-G formula based on SCr of 1.16 mg/dL).  Assessment:  43 yo male admitted 02/22/18 with SOB and chest pain and RLE swelliing. Had urological procedure earlier this month (01/30/18). CTA showed bilateral PE and duplex showed RLE DVT.  IV heparin begun last night.   Initial heparin level therapeutic (0.41) on 1700 units/hr and remains therapeutic (0.44) today.  Goal of Therapy:  Heparin level 0.3-0.7 units/ml Monitor platelets by anticoagulation protocol: Yes   Plan:   Continue heparin drip at 1700 units/hr.  Daily heparin level and CBC while on heparin.  Follow up oral anticoaguation plans.  Dennie Fettersgan, Demyan Fugate Donovan, [place] Pager: [phone] or phone: [phone] 02/23/2018,1:07 PM

## 2018-02-23 NOTE — ED Notes (Signed)
Report given to Suzette Battiest, RN on 2W at [facility]

## 2018-02-23 NOTE — Progress Notes (Signed)
  Echocardiogram 2D Echocardiogram has been performed.  Shakim Faith L Androw 02/23/2018, 10:17 AM

## 2018-02-23 NOTE — Progress Notes (Signed)
ANTICOAGULATION CONSULT NOTE   Pharmacy Consult for Heparin Indication: pulmonary embolus  Patient Measurements: Height: 6\' 4"  (193 cm) Weight: 260 lb 12.9 oz (118.3 kg) IBW/kg (Calculated) : 86.8 Heparin Dosing Weight: 112.7 kg  Vital Signs: Temp: 97.9 F (36.6 C) (01/31 0259) Temp Source: Oral (01/31 0259) BP: 123/86 (01/31 0259) Pulse Rate: 74 (01/31 0259)  Labs: Recent Labs    02/22/18 1827 02/23/18 0514  HGB 14.4 13.8  HCT 44.3 42.9  PLT 211 192  HEPARINUNFRC  --  0.41  CREATININE 1.16  --   TROPONINI <0.03  --     Assessment: 43 yo male with SOB and chest pain. Had surgery earlier this month. CTA today shows bilateral PE with RV/LV ratio < 1. Planning to initiate heparin infusion. CBC wnl, SCr 1.1.   1/31 AM update: initial heparin level therapeutic    Goal of Therapy:  Heparin level 0.3-0.7 units/ml Monitor platelets by anticoagulation protocol: Yes    Plan:  -Cont heparin 1700 units/hr -Confirmatory heparin level at 1200  Abran Duke 02/23/2018,6:08 AM

## 2018-02-23 NOTE — Care Management Note (Signed)
Case Management Note  Patient Details  Name: Dakota Compton MRN: [id number] Date of Birth: May 14, 1975  Subjective/Objective: from home, pta indep, PE, on heparin , will be on xarelto,  NCM gave the [facility] pharmacy the 30 day free coupon for the xarelto and informed patient that his copay for refills will be zero dollars per the benefit check.  TOC pharmacy will fill the xarelto and bring to patient room today 1/31.                    Action/Plan: DC home when ready.   Expected Discharge Date:  02/25/18               Expected Discharge Plan:  Home/Self Care  In-House Referral:     Discharge planning Services  Medication Assistance  Post Acute Care Choice:    Choice offered to:     DME Arranged:    DME Agency:     HH Arranged:    HH Agency:     Status of Service:  Completed, signed off  If discussed at [employer] of Stay Meetings, dates discussed:    Additional Comments:  Leone Haven, RN 02/23/2018, 2:35 PM

## 2018-02-23 NOTE — H&P (Signed)
History and Physical    SAQIB SUNDE QDI:[id number] DOB: 1975-02-18 DOA: 02/22/2018  PCP: [facility]  Patient coming from: home  Chief Complaint:  Sob, rle swelling  HPI: QUAN FORNEY is a 43 y.o. male with no medical history significant comes in with over a week of progressive worsening sob associated with right calf pain for a week.  Pt underwent a procedure urethra stricture dilation on the 1/7 and reports he went home with a foley and slept for over 12 hours a day.  The foley was removed over a week ago.  No fevers.  No hemoptysis.  Pt found to have rle dvt and bilateral PE and referred for admission for such.  No previous h/o VTE.  Review of Systems: As per HPI otherwise 10 point review of systems negative.   History reviewed. No pertinent past medical history. none  Past Surgical History:  Procedure Laterality Date  . URETHRA SURGERY       reports that he has never smoked. He has never used smokeless tobacco. He reports that he does not drink alcohol or use drugs.  Allergies  Allergen Reactions  . Penicillins     No family history on file. no premature CAD  Prior to Admission medications   Not on File  none  Physical Exam: Vitals:   02/23/18 0000 02/23/18 0030 02/23/18 0200 02/23/18 0259  BP: (!) 126/92 130/82  123/86  Pulse: 84 80  74  Resp: 19 16  16   Temp:    97.9 F (36.6 C)  TempSrc:    Oral  SpO2: 99% 98%  99%  Weight:   118.3 kg   Height:   6\' 4"  (1.93 m)     Constitutional: NAD, calm, comfortable Vitals:   02/23/18 0000 02/23/18 0030 02/23/18 0200 02/23/18 0259  BP: (!) 126/92 130/82  123/86  Pulse: 84 80  74  Resp: 19 16  16   Temp:    97.9 F (36.6 C)  TempSrc:    Oral  SpO2: 99% 98%  99%  Weight:   118.3 kg   Height:   6\' 4"  (1.93 m)    Eyes: PERRL, lids and conjunctivae normal ENMT: Mucous membranes are moist. Posterior pharynx clear of any exudate or lesions.Normal dentition.  Neck: normal, supple, no masses, no  thyromegaly Respiratory: clear to auscultation bilaterally, no wheezing, no crackles. Normal respiratory effort. No accessory muscle use.  Cardiovascular: Regular rate and rhythm, no murmurs / rubs / gallops. No extremity edema. 2+ pedal pulses. No carotid bruits.  Abdomen: no tenderness, no masses palpated. No hepatosplenomegaly. Bowel sounds positive.  Musculoskeletal: no clubbing / cyanosis. No joint deformity upper and lower extremities. Good ROM, no contractures. Normal muscle tone.  Skin: no rashes, lesions, ulcers. No induration Neurologic: CN 2-12 grossly intact. Sensation intact, DTR normal. Strength 5/5 in all 4.  Psychiatric: Normal judgment and insight. Alert and oriented x 3. Normal mood.    Labs on Admission: I have personally reviewed following labs and imaging studies  CBC: Recent Labs  Lab 02/22/18 1827  WBC 6.5  NEUTROABS 3.7  HGB 14.4  HCT 44.3  MCV 89.5  PLT 211   Basic Metabolic Panel: Recent Labs  Lab 02/22/18 1827  NA 136  K 4.0  CL 102  CO2 26  GLUCOSE 109*  BUN 18  CREATININE 1.16  CALCIUM 9.4   GFR: Estimated Creatinine Clearance: 115.4 mL/min (by C-G formula based on SCr of 1.16 mg/dL). Liver Function Tests: No  results for input(s): AST, ALT, ALKPHOS, BILITOT, PROT, ALBUMIN in the last 168 hours. No results for input(s): LIPASE, AMYLASE in the last 168 hours. No results for input(s): AMMONIA in the last 168 hours. Coagulation Profile: No results for input(s): INR, PROTIME in the last 168 hours. Cardiac Enzymes: Recent Labs  Lab 02/22/18 1827  TROPONINI <0.03   BNP (last 3 results) No results for input(s): PROBNP in the last 8760 hours. HbA1C: No results for input(s): HGBA1C in the last 72 hours. CBG: No results for input(s): GLUCAP in the last 168 hours. Lipid Profile: No results for input(s): CHOL, HDL, LDLCALC, TRIG, CHOLHDL, LDLDIRECT in the last 72 hours. Thyroid Function Tests: No results for input(s): TSH, T4TOTAL, FREET4,  T3FREE, THYROIDAB in the last 72 hours. Anemia Panel: No results for input(s): VITAMINB12, FOLATE, FERRITIN, TIBC, IRON, RETICCTPCT in the last 72 hours. Urine analysis: No results found for: COLORURINE, APPEARANCEUR, LABSPEC, PHURINE, GLUCOSEU, HGBUR, BILIRUBINUR, KETONESUR, PROTEINUR, UROBILINOGEN, NITRITE, LEUKOCYTESUR Sepsis Labs: !!!!!!!!!!!!!!!!!!!!!!!!!!!!!!!!!!!!!!!!!!!! @LABRCNTIP (procalcitonin:4,lacticidven:4) ) Recent Results (from the past 240 hour(s))  Group A Strep by PCR     Status: None   Collection Time: 02/22/18  6:27 PM  Result Value Ref Range Status   Group A Strep by PCR NOT DETECTED NOT DETECTED Final    Comment: Performed at [facility] [place], [address] [place], [place] [address]     Radiological Exams on Admission: Dg Chest 2 View  Result Date: 02/22/2018 CLINICAL DATA:  Shortness of breath EXAM: CHEST - 2 VIEW COMPARISON:  None. FINDINGS: The heart size and mediastinal contours are within normal limits. Both lungs are clear. The visualized skeletal structures are unremarkable. IMPRESSION: No active cardiopulmonary disease. Electronically Signed   By: Deatra RobinsonKevin  Herman M.D.   On: 02/22/2018 19:17   Ct Angio Chest Pe W And/or Wo Contrast  Result Date: 02/22/2018 CLINICAL DATA:  Chest pain and shortness of breath for 1 week. History of recent urological surgery. Right calf pain. Positive D-dimer. EXAM: CT ANGIOGRAPHY CHEST WITH CONTRAST TECHNIQUE: Multidetector CT imaging of the chest was performed using the standard protocol during bolus administration of intravenous contrast. Multiplanar CT image reconstructions and MIPs were obtained to evaluate the vascular anatomy. CONTRAST:  100mL ISOVUE-370 IOPAMIDOL (ISOVUE-370) INJECTION 76% COMPARISON:  None. FINDINGS: Cardiovascular: The heart is normal in size. No pericardial effusion. The aorta is normal in caliber. No dissection. The branch vessels are patent. No coronary artery calcifications are demonstrated. The  pulmonary arterial tree is fairly well opacified. There are bilateral lower lobe pulmonary artery filling defects consistent with pulmonary embolism. Moderate appearing clot burden. There is slight flattening of the left ventricle which may suggest mild right heart strain but the RV LV ratio is less than 1. Mediastinum/Nodes: No mediastinal or hilar mass or lymphadenopathy. The esophagus is grossly normal. Lungs/Pleura: No acute pulmonary findings. No evidence of pulmonary infarct or infiltrate. No pleural effusion. Upper Abdomen: No significant upper abdominal findings. Fatty infiltration of the liver is noted. Musculoskeletal: No significant bony findings. Review of the MIP images confirms the above findings. IMPRESSION: 1. Bilateral lower lobe pulmonary emboli with moderate clot burden. Slight flattening of the left ventricle suggests mild right heart strain. However, the RV LV ratio is less than 1. 2. Normal appearance of the aorta. 3. No acute pulmonary findings. 4. Diffuse fatty infiltration of the liver. Electronically Signed   By: Rudie MeyerP.  Gallerani M.D.   On: 02/22/2018 21:14   [place] Venous Img Lower Unilateral Right  Result Date:  02/22/2018 CLINICAL DATA:  Right calf pain.  Pulmonary emboli. EXAM: RIGHT LOWER EXTREMITY VENOUS DOPPLER ULTRASOUND TECHNIQUE: Gray-scale sonography with graded compression, as well as color Doppler and duplex ultrasound were performed to evaluate the lower extremity deep venous systems from the level of the common femoral vein and including the common femoral, femoral, profunda femoral, popliteal and calf veins including the posterior tibial, peroneal and gastrocnemius veins when visible. The superficial great saphenous vein was also interrogated. Spectral Doppler was utilized to evaluate flow at rest and with distal augmentation maneuvers in the common femoral, femoral and popliteal veins. COMPARISON:  None. FINDINGS: Contralateral Common Femoral Vein: Respiratory phasicity is  normal and symmetric with the symptomatic side. No evidence of thrombus. Normal compressibility. Common Femoral Vein: No evidence of thrombus. Normal compressibility, respiratory phasicity and response to augmentation. Saphenofemoral Junction: No evidence of thrombus. Normal compressibility and flow on color Doppler imaging. Profunda Femoral Vein: No evidence of thrombus. Normal compressibility and flow on color Doppler imaging. Femoral Vein: Partially occlusive thrombus noted in the distal femoral vein. Popliteal Vein: No evidence of thrombus. Normal compressibility, respiratory phasicity and response to augmentation. Calf Veins: Not well visualized. Superficial Great Saphenous Vein: No evidence of thrombus. Normal compressibility. Venous Reflux:  None. Other Findings:  None. IMPRESSION: Partially occlusive thrombus noted in the distal right femoral vein. Electronically Signed   By: Charlett Nose M.D.   On: 02/22/2018 21:44    EKG: Independently reviewed. nsr no acute changes Old chart reviewed  Assessment/Plan 43 yo male with rle dvt and bilateral PE  Principal Problem:   Acute pulmonary embolism (HCC)- heparin drip.  Transition to po in the next 24 to 48 hours based on what his insurance covers.  Trop neg.  cta shows poss heart strain but pt with no physiological evidence of such, afvss, nml o2 sats.  Serial trop.  hypercoag w/u ordered will be done in the setting however of already starting heparin.    Active Problems:   DVT (deep venous thrombosis) (HCC)- heparin as above     DVT prophylaxis: heparin Code Status: full Family Communication: none Disposition Plan: 1-2 days  Consults called: none Admission status:  observation   Eliany Mccarter A MD [facility]  If 7PM-7AM, please contact night-coverage [email or web address] Password [facility]  02/23/2018, 3:37 AM

## 2018-02-24 DIAGNOSIS — I82411 Acute embolism and thrombosis of right femoral vein: Secondary | ICD-10-CM

## 2018-02-24 LAB — CARDIOLIPIN ANTIBODIES, IGG, IGM, IGA: Anticardiolipin IgG: <9 GPL U/mL (ref 0–14)

## 2018-02-24 LAB — HOMOCYSTEINE: Homocysteine: 11.6 umol/L (ref 0.0–14.5)

## 2018-02-24 MED ORDER — RIVAROXABAN 20 MG PO TABS: 20.0000 mg | ORAL_TABLET | Freq: Every day | ORAL | 2 refills | Status: DC

## 2018-02-24 NOTE — Discharge Summary (Signed)
Physician Discharge Summary  Dakota Compton ZOX:[id number] DOB: August 18, 1975 DOA: 02/22/2018  PCP: [facility]  Admit date: 02/22/2018 Discharge date: 02/24/2018  Admitted From: Home  Disposition:  Home   Recommendations for Outpatient Follow-up:  1. Follow up with PCP in 1 week 2. Please check CBC in 3-4 weeks 3. Please discuss patient's concerns regarding sleep apnea 4. Please refer for hematology consultation in 4-6 months 5. Please follow up on the following tests: 1. Protein C activity 2. Protein S activity 3. Lupus anticoagulant 4. Beta-2 glycoprotein antibodies 5. Homocystine level 6. Factor V Leiden assay 7. Prothrombin gene mutation assay 8. Cardiolipin antibodies      Home Health: None  Equipment/Devices: None  Discharge Condition: Good  CODE STATUS: FUL Diet recommendation: Regular  Brief/Interim Summary: Dakota Compton is a 43 y.o. M with GERD who presents with 2 weeks of right leg pain, and now dyspnea on exertion.  The patient was in his usual state of health until earlier this month he underwent a urethral stricture dilation.  He was subsequently very sedentary, only getting up to use the bathroom for over a week.  During that time he developed some right calf and leg pain/cramps, and subsequent to that some dyspnea.  Finally when he developed some URI symptoms (nasal congestion and sore throat) he presented to the hospital.    In the ER, d-dimer noted to be elevated.  US dopplers showed RLE clot and CTA chest showed bilateral PE with RV strain pattern.  Started on heparin.     PRINCIPAL HOSPITAL DIAGNOSIS: Acute pulmonary embolism    Discharge Diagnoses:    Acute provoked DVT/PE This was a first event, provoked in the setting of recent urologic surgery and bed rest.  He has no significant family history of VTE.  Troponins negative, but RV strain noted on CT angiogram.  Follow up echocardiogram showed no right heart strain, normal LVSF.     Transitioned from heparin to Xarelto.  Rx provided at discharge.  Hypercoagulable panel pending at discharge, NOTE: was collected after initiation of heparin.    Recommend hematology referral in 6 months to discuss hypercoagulable panel, necessity for repeating testing of anticoagulation in order to decide regarding lifelong anticoagulation vs just 6 months.      Snoring Patient had snoring respirations and apneas noted by nursing overnight in the hospital.   -Please screen for sleep apnea, and refer for PSG if positive  Recent urethral stricture procedure Patient without hematuria after starting anticoagulation.  Instructed to inform his Urologist asap regarding any hematuria.         Discharge Instructions  Discharge Instructions    Diet general   Complete by:  As directed    Discharge instructions   Complete by:  As directed    From Dr. Maryfrances Bunnell: You were admitted with blood clots in your lungs.  This came from your right leg, broke apart, and several small bits of clot lodged in both lungs.  Images of your heart showed no strain or effect of the clot on your heart. Taking the blood thinner will prevent the clot from getting bigger or worse, as your body slowly chews the existing clot up. Any pain or feeling out of breath from the clot will slowly go away over the next 2-3 weeks  Take Xarelto 15 mg twice daily with meals for 3 weeks Then take Xarelto 20 mg nightly with dinner   You should take at least 6 months of the blood thinner.  While you were here, some lab testing was sent to test you for genetic conditions that make people likely to clot (like "factor V leiden", but also many other conditions).  Towards the end of the six months of Xarelto, I think it is reasonable to ask your primary care doctor for a referral to a hematologist (blood specialist) to interpret the results of those tests, and help you decide if you can stop the blood thinner safely in six  months or if you should continue it forever.   If you have bleeding from your urine or in your stools (including black and sticky bowel movements or black diarrhea) call your primary care doctore IMMEDIATELY  Call Dr. Lindley Magnus to let his nurse know you are on a blood thinner now   Ask your primary care doctor about sleep apnea testing   Increase activity slowly   Complete by:  As directed      Allergies as of 02/24/2018      Reactions   Penicillins Anaphylaxis, Hives, Swelling   Did it involve swelling of the face/tongue/throat, SOB, or low BP? Yes Did it involve sudden or severe rash/hives, skin peeling, or any reaction on the inside of your mouth or nose? Yes Did you need to seek medical attention at a hospital or doctor's office? Yes When did it last happen?childhood If all above answers are "NO", may proceed with cephalosporin use.      Medication List    TAKE these medications   APPLE CIDER VINEGAR PO Take 2 capsules by mouth daily.   BIOTIN PO Take 1 tablet by mouth daily.   cholecalciferol 25 MCG (1000 UT) tablet Commonly known as:  VITAMIN D Take 1,000 Units by mouth daily.   CINNAMON PO Take 1 capsule by mouth daily.   ELDERBERRY PO Take 2 tablets by mouth daily.   multivitamin with minerals Tabs tablet Take 1 tablet by mouth daily.   omeprazole 20 MG capsule Commonly known as:  PRILOSEC Take 20 mg by mouth daily.   Rivaroxaban 15 & 20 MG Tbpk Take as directed on package: Start with one 15mg  tablet by mouth twice a day with food. On Day 22, switch to one 20mg  tablet daily with food   rivaroxaban 20 MG Tabs tablet Commonly known as:  XARELTO Take 1 tablet (20 mg total) by mouth daily with supper. Start taking on:  March 22, 2018   Turmeric Curcumin Caps Take 1 capsule by mouth daily.       Allergies  Allergen Reactions  . Penicillins Anaphylaxis, Hives and Swelling    Did it involve swelling of the face/tongue/throat, SOB, or low BP?  Yes Did it involve sudden or severe rash/hives, skin peeling, or any reaction on the inside of your mouth or nose? Yes Did you need to seek medical attention at a hospital or doctor's office? Yes When did it last happen?childhood If all above answers are "NO", may proceed with cephalosporin use.     Consultations:  None   Procedures/Studies: Dg Chest 2 View  Result Date: 02/22/2018 CLINICAL DATA:  Shortness of breath EXAM: CHEST - 2 VIEW COMPARISON:  None. FINDINGS: The heart size and mediastinal contours are within normal limits. Both lungs are clear. The visualized skeletal structures are unremarkable. IMPRESSION: No active cardiopulmonary disease. Electronically Signed   By: Deatra Robinson M.D.   On: 02/22/2018 19:17   Ct Angio Chest Pe W And/or Wo Contrast  Result Date: 02/22/2018 CLINICAL DATA:  Chest pain and  shortness of breath for 1 week. History of recent urological surgery. Right calf pain. Positive D-dimer. EXAM: CT ANGIOGRAPHY CHEST WITH CONTRAST TECHNIQUE: Multidetector CT imaging of the chest was performed using the standard protocol during bolus administration of intravenous contrast. Multiplanar CT image reconstructions and MIPs were obtained to evaluate the vascular anatomy. CONTRAST:  ISOVUE-370 IOPAMIDOL (ISOVUE-370) INJECTION 76% COMPARISON:  None. FINDINGS: Cardiovascular: The heart is normal in size. No pericardial effusion. The aorta is normal in caliber. No dissection. The branch vessels are patent. No coronary artery calcifications are demonstrated. The pulmonary arterial tree is fairly well opacified. There are bilateral lower lobe pulmonary artery filling defects consistent with pulmonary embolism. Moderate appearing clot burden. There is slight flattening of the left ventricle which may suggest mild right heart strain but the RV LV ratio is less than 1. Mediastinum/Nodes: No mediastinal or hilar mass or lymphadenopathy. The esophagus is grossly normal.  Lungs/Pleura: No acute pulmonary findings. No evidence of pulmonary infarct or infiltrate. No pleural effusion. Upper Abdomen: No significant upper abdominal findings. Fatty infiltration of the liver is noted. Musculoskeletal: No significant bony findings. Review of the MIP images confirms the above findings. IMPRESSION: 1. Bilateral lower lobe pulmonary emboli with moderate clot burden. Slight flattening of the left ventricle suggests mild right heart strain. However, the RV LV ratio is less than 1. 2. Normal appearance of the aorta. 3. No acute pulmonary findings. 4. Diffuse fatty infiltration of the liver. Electronically Signed   By: Rudie Meyer M.D.   On: 02/22/2018 21:14   US Venous Img Lower Unilateral Right  Result Date: 02/22/2018 CLINICAL DATA:  Right calf pain.  Pulmonary emboli. EXAM: RIGHT LOWER EXTREMITY VENOUS DOPPLER ULTRASOUND TECHNIQUE: Gray-scale sonography with graded compression, as well as color Doppler and duplex ultrasound were performed to evaluate the lower extremity deep venous systems from the level of the common femoral vein and including the common femoral, femoral, profunda femoral, popliteal and calf veins including the posterior tibial, peroneal and gastrocnemius veins when visible. The superficial great saphenous vein was also interrogated. Spectral Doppler was utilized to evaluate flow at rest and with distal augmentation maneuvers in the common femoral, femoral and popliteal veins. COMPARISON:  None. FINDINGS: Contralateral Common Femoral Vein: Respiratory phasicity is normal and symmetric with the symptomatic side. No evidence of thrombus. Normal compressibility. Common Femoral Vein: No evidence of thrombus. Normal compressibility, respiratory phasicity and response to augmentation. Saphenofemoral Junction: No evidence of thrombus. Normal compressibility and flow on color Doppler imaging. Profunda Femoral Vein: No evidence of thrombus. Normal compressibility and flow on  color Doppler imaging. Femoral Vein: Partially occlusive thrombus noted in the distal femoral vein. Popliteal Vein: No evidence of thrombus. Normal compressibility, respiratory phasicity and response to augmentation. Calf Veins: Not well visualized. Superficial Great Saphenous Vein: No evidence of thrombus. Normal compressibility. Venous Reflux:  None. Other Findings:  None. IMPRESSION: Partially occlusive thrombus noted in the distal right femoral vein. Electronically Signed   By: Charlett Nose M.D.   On: 02/22/2018 21:44   Echocardiogram   1. The left ventricle has normal systolic function of 60-65%. The cavity size is normal. There is mild left ventricular wall thickness of the all wall. Echo evidence of normal diastolic filling patterns.  2. Normal left atrial size.  3. Normal right atrial size.  4. Normal tricuspid valve.  5. No atrial level shunt detected by color flow Doppler.       Subjective: Feels wel.  Mild dyspnea overnight, no hypoxia.  No chest pain.  No syncope.  No hypotnsion.  Still with sore throat.  No hematuria.  Still with cough and nasal congestion.  Discharge Exam: Vitals:   02/23/18 2323 02/24/18 0807  BP: 127/80 116/73  Pulse: 89 75  Resp:  17  Temp: 97.9 F (36.6 C)   SpO2: 95% 99%   Vitals:   02/23/18 0736 02/23/18 1714 02/23/18 2323 02/24/18 0807  BP: 124/85 121/83 127/80 116/73  Pulse: 77 87 89 75  Resp: 18 18  17   Temp: (!) 97.5 F (36.4 C) 98.1 F (36.7 C) 97.9 F (36.6 C)   TempSrc: Oral Oral Oral   SpO2: 95% 94% 95% 99%  Weight:      Height:        General: Pt is alert, awake, not in acute distress Cardiovascular: RRR, nl S1-S2, no murmurs appreciated.   No LE edema.   Respiratory: Normal respiratory rate and rhythm.  CTAB without rales or wheezes. Abdominal: Abdomen soft and non-tender.  No distension or HSM.   Neuro/Psych: Strength symmetric in upper and lower extremities.  Judgment and insight appear normal.   The results of  significant diagnostics from this hospitalization (including imaging, microbiology, ancillary and laboratory) are listed below for reference.     Microbiology: Recent Results (from the past 240 hour(s))  Group A Strep by PCR     Status: None   Collection Time: 02/22/18  6:27 PM  Result Value Ref Range Status   Group A Strep by PCR NOT DETECTED NOT DETECTED Final    Comment: Performed at [facility] [place], [address] [place], [place] [id number]     Labs: BNP (last 3 results) No results for input(s): BNP in the last 8760 hours. Basic Metabolic Panel: Recent Labs  Lab 02/22/18 1827  NA 136  K 4.0  CL 102  CO2 26  GLUCOSE 109*  BUN 18  CREATININE 1.16  CALCIUM 9.4   Liver Function Tests: No results for input(s): AST, ALT, ALKPHOS, BILITOT, PROT, ALBUMIN in the last 168 hours. No results for input(s): LIPASE, AMYLASE in the last 168 hours. No results for input(s): AMMONIA in the last 168 hours. CBC: Recent Labs  Lab 02/22/18 1827 02/23/18 0514  WBC 6.5 6.4  NEUTROABS 3.7  --   HGB 14.4 13.8  HCT 44.3 42.9  MCV 89.5 88.3  PLT 211 192   Cardiac Enzymes: Recent Labs  Lab 02/22/18 1827 02/23/18 0514 02/23/18 1144 02/23/18 1703  TROPONINI <0.03 <0.03 <0.03 <0.03   BNP: Invalid input(s): POCBNP CBG: No results for input(s): GLUCAP in the last 168 hours. D-Dimer Recent Labs    02/22/18 1827  DDIMER 2.24*   Hgb A1c No results for input(s): HGBA1C in the last 72 hours. Lipid Profile No results for input(s): CHOL, HDL, LDLCALC, TRIG, CHOLHDL, LDLDIRECT in the last 72 hours. Thyroid function studies No results for input(s): TSH, T4TOTAL, T3FREE, THYROIDAB in the last 72 hours.  Invalid input(s): FREET3 Anemia work up No results for input(s): VITAMINB12, FOLATE, FERRITIN, TIBC, IRON, RETICCTPCT in the last 72 hours. Urinalysis No results found for: COLORURINE, APPEARANCEUR, LABSPEC, PHURINE, GLUCOSEU, HGBUR, BILIRUBINUR, KETONESUR, PROTEINUR,  UROBILINOGEN, NITRITE, LEUKOCYTESUR Sepsis Labs Invalid input(s): PROCALCITONIN,  WBC,  LACTICIDVEN Microbiology Recent Results (from the past 240 hour(s))  Group A Strep by PCR     Status: None   Collection Time: 02/22/18  6:27 PM  Result Value Ref Range Status   Group A Strep by PCR NOT DETECTED NOT  DETECTED Final    Comment: Performed at [facility], [address] [place], [place] [address]     Time coordinating discharge: 40 minutes       SIGNED:   Alberteen Sam, MD  [facility] 02/24/2018, 11:43 AM

## 2018-02-25 LAB — PROTEIN C ACTIVITY: Protein C Activity: 135 % (ref 73–180)

## 2018-02-25 LAB — PROTEIN S, TOTAL: Protein S Ag, Total: 105 % (ref 60–150)

## 2018-02-25 LAB — PROTEIN S ACTIVITY: Protein S Activity: 121 % (ref 63–140)

## 2018-02-26 LAB — LUPUS ANTICOAGULANT PANEL
DRVVT: 60 s — AB (ref 0.0–47.0)
PTT Lupus Anticoagulant: 37 s (ref 0.0–51.9)

## 2018-02-26 LAB — DRVVT MIX: dRVVT Mix: 45.7 s (ref 0.0–47.0)

## 2018-02-26 LAB — PROTEIN C, TOTAL: Protein C, Total: 103 % (ref 60–150)

## 2018-02-27 LAB — FACTOR 5 LEIDEN

## 2018-02-27 LAB — BETA-2-GLYCOPROTEIN I ABS, IGG/M/A
Beta-2 Glyco I IgG: <9 GPI IgG units (ref 0–20)
Beta-2-Glycoprotein I IgM: <9 GPI IgM units (ref 0–32)

## 2018-03-01 LAB — PROTHROMBIN GENE MUTATION

## 2019-01-26 ENCOUNTER — Emergency Department (HOSPITAL_BASED_OUTPATIENT_CLINIC_OR_DEPARTMENT_OTHER): Payer: BC Managed Care – PPO

## 2019-01-26 ENCOUNTER — Emergency Department (HOSPITAL_BASED_OUTPATIENT_CLINIC_OR_DEPARTMENT_OTHER)
Admission: EM | Admit: 2019-01-26 | Discharge: 2019-01-27 | Disposition: A | Discharge status: Home / Self Care | Payer: BC Managed Care – PPO | Attending: Emergency Medicine | Admitting: Emergency Medicine

## 2019-01-26 ENCOUNTER — Encounter (HOSPITAL_BASED_OUTPATIENT_CLINIC_OR_DEPARTMENT_OTHER): Payer: Self-pay | Admitting: Emergency Medicine

## 2019-01-26 ENCOUNTER — Other Ambulatory Visit: Payer: Self-pay

## 2019-01-26 DIAGNOSIS — Z20822 Contact with and (suspected) exposure to covid-19: Secondary | ICD-10-CM

## 2019-01-26 DIAGNOSIS — K219 Gastro-esophageal reflux disease without esophagitis: Secondary | ICD-10-CM | POA: Diagnosis not present

## 2019-01-26 DIAGNOSIS — R0602 Shortness of breath: Secondary | ICD-10-CM | POA: Diagnosis present

## 2019-01-26 DIAGNOSIS — U071 COVID-19: Secondary | ICD-10-CM | POA: Insufficient documentation

## 2019-01-26 DIAGNOSIS — Z79899 Other long term (current) drug therapy: Secondary | ICD-10-CM | POA: Insufficient documentation

## 2019-01-26 HISTORY — DX: Gastro-esophageal reflux disease without esophagitis: K21.9

## 2019-01-26 HISTORY — DX: Other pulmonary embolism without acute cor pulmonale: I26.99

## 2019-01-26 HISTORY — DX: Acute embolism and thrombosis of unspecified deep veins of unspecified lower extremity: I82.409

## 2019-01-26 LAB — CBC WITH DIFFERENTIAL/PLATELET
Abs Immature Granulocytes: 0.01 10*3/uL (ref 0.00–0.07)
Basophils Absolute: 0 10*3/uL (ref 0.0–0.1)
Basophils Relative: 0 %
Eosinophils Absolute: 0 10*3/uL (ref 0.0–0.5)
Eosinophils Relative: 0 %
HCT: 50.3 % (ref 39.0–52.0)
Hemoglobin: 16.3 g/dL (ref 13.0–17.0)
Immature Granulocytes: 0 %
Lymphocytes Relative: 33 %
Lymphs Abs: 1.4 10*3/uL (ref 0.7–4.0)
MCH: 28.6 pg (ref 26.0–34.0)
MCHC: 32.4 g/dL (ref 30.0–36.0)
MCV: 88.4 fL (ref 80.0–100.0)
Monocytes Absolute: 0.2 10*3/uL (ref 0.1–1.0)
Monocytes Relative: 6 %
Neutro Abs: 2.6 10*3/uL (ref 1.7–7.7)
Neutrophils Relative %: 61 %
Platelets: 174 10*3/uL (ref 150–400)
RBC: 5.69 MIL/uL (ref 4.22–5.81)
RDW: 12.7 % (ref 11.5–15.5)
WBC: 4.3 10*3/uL (ref 4.0–10.5)
nRBC: 0 % (ref 0.0–0.2)

## 2019-01-26 LAB — SARS CORONAVIRUS 2 AG (30 MIN TAT): SARS Coronavirus 2 Ag: NEGATIVE

## 2019-01-26 MED ORDER — ALBUTEROL SULFATE HFA 108 (90 BASE) MCG/ACT IN AERS
2.0000 | INHALATION_SPRAY | RESPIRATORY_TRACT | Status: DC | PRN
  Administered 2019-01-27: 2 via RESPIRATORY_TRACT
  Filled 2019-01-26: qty 6.7

## 2019-01-26 MED ORDER — PANTOPRAZOLE SODIUM 40 MG IV SOLR
40.0000 mg | Freq: Once | INTRAVENOUS | Status: AC
  Administered 2019-01-27: 40 mg via INTRAVENOUS
  Filled 2019-01-26: qty 40

## 2019-01-26 NOTE — ED Triage Notes (Signed)
SOB and body aches for several days. Known exposure to COVID

## 2019-01-26 NOTE — ED Provider Notes (Signed)
[facility]PT [facility] Provider Note: Dakota Spurling, MD, FACEP  CSN: [id number] MRN: [id number] ARRIVAL: 01/26/19 at [place]: [place]  01/26/19 11:06 PM Dakota Compton is a 44 y.o. male who was recently treated for deep vein thrombosis but has been off Xarelto for several months.  He is here with about 3 days of low-grade fever, body aches, shortness of breath and chest tightness.  He rates his body pain as a 10 out of 10.  He has had difficulty sleeping supine due to acid reflux and he is not currently taking his acid reflux medication.  He has had an occasional cough productive of sputum.  He is having pain in his right leg now which he describes as feeling like a muscle spasm.  He also feels achy in his eye sockets.  He has had Covid exposure in his family but states he tested -4 days ago.  His antigen test here is negative, PCR is pending.   Past Medical History:  Diagnosis Date  . DVT (deep venous thrombosis) ([place])   . GERD (gastroesophageal reflux disease)   . Pulmonary emboli [facility])     Past Surgical History:  Procedure Laterality Date  . URETHRA SURGERY      No family history on file.  Social History   Tobacco Use  . Smoking status: Never Smoker  . Smokeless tobacco: Never Used  Substance Use Topics  . Alcohol use: Never  . Drug use: Never    Prior to Admission medications   Medication Sig Start Date End Date Taking? Authorizing Provider  APPLE CIDER VINEGAR PO Take 2 capsules by mouth daily.    [provider]  BIOTIN PO Take 1 tablet by mouth daily.    [provider]  cholecalciferol (VITAMIN D) 25 MCG (1000 UT) tablet Take 1,000 Units by mouth daily.    [provider]  CINNAMON PO Take 1 capsule by mouth daily.    [provider]  ELDERBERRY PO Take 2 tablets by mouth daily.    [provider]  Misc Natural Products (TURMERIC CURCUMIN)  CAPS Take 1 capsule by mouth daily.    [provider]  Multiple Vitamin (MULTIVITAMIN WITH MINERALS) TABS tablet Take 1 tablet by mouth daily.    [provider]  omeprazole (PRILOSEC) 20 MG capsule Take 1 capsule (20 mg total) by mouth daily. 01/27/19   Thelonious Kauffmann, MD  rivaroxaban (XARELTO) 20 MG TABS tablet Take 1 tablet (20 mg total) by mouth daily with supper. 03/22/18   Danford, Suann Larry, MD  Rivaroxaban 15 & 20 MG TBPK Take as directed on package: Start with one 48m tablet by mouth twice a day with food. On Day 22, switch to one 268mtablet daily with food 02/23/18   Danford, ChSuann LarryMD    Allergies Penicillins   REVIEW OF SYSTEMS  Negative except as noted here or in the History of Present Illness.   PHYSICAL EXAMINATION  Initial Vital Signs Blood pressure (!) 148/95, pulse 93, temperature 98.3 F (36.8 C), resp. rate 18, height _0  (1.93 m), weight 117.9 kg, SpO2 100 %.  Examination General: Well-developed, well-nourished male in no acute distress; appearance consistent with age of record HENT: normocephalic; atraumatic Eyes: pupils equal, round and reactive to light; extraocular muscles intact Neck: supple Heart: regular rate and rhythm Lungs: clear to auscultation bilaterally Abdomen: soft; nondistended; nontender; bowel sounds present  Extremities: No deformity; full range of motion; pulses normal; lower legs in compression stockings Neurologic: Awake, alert and oriented; motor function intact in all extremities and symmetric; no facial droop Skin: Warm and dry Psychiatric: Normal mood and affect   RESULTS  Summary of this visit's results, reviewed and interpreted by myself:   EKG Interpretation  Date/Time:  Saturday January 26 2019 16:12:35 EST Ventricular Rate:  92 PR Interval:  166 QRS Duration: 84 QT Interval:  344 QTC Calculation: 425 R Axis:   84 Text Interpretation: Normal sinus rhythm Normal ECG No significant change was  found Confirmed by Ezequiel Essex [phone]) on 01/26/2019 4:19:44 PM      Laboratory Studies: Results for orders placed or performed during the hospital encounter of 01/26/19 (from the past 24 hour(s))  SARS Coronavirus 2 Ag (30 min TAT) - Nasal Swab (BD Veritor Kit)     Status: None   Collection Time: 01/26/19  9:56 PM   Specimen: Nasal Swab (BD Veritor Kit)  Result Value Ref Range   SARS Coronavirus 2 Ag NEGATIVE NEGATIVE  CBC with Differential     Status: None   Collection Time: 01/26/19 11:23 PM  Result Value Ref Range   WBC 4.3 4.0 - 10.5 K/uL   RBC 5.69 4.22 - 5.81 MIL/uL   Hemoglobin 16.3 13.0 - 17.0 g/dL   HCT 50.3 39.0 - 52.0 %   MCV 88.4 80.0 - 100.0 fL   MCH 28.6 26.0 - 34.0 pg   MCHC 32.4 30.0 - 36.0 g/dL   RDW 12.7 11.5 - 15.5 %   Platelets 174 150 - 400 K/uL   nRBC 0.0 0.0 - 0.2 %   Neutrophils Relative % 61 %   Neutro Abs 2.6 1.7 - 7.7 K/uL   Lymphocytes Relative 33 %   Lymphs Abs 1.4 0.7 - 4.0 K/uL   Monocytes Relative 6 %   Monocytes Absolute 0.2 0.1 - 1.0 K/uL   Eosinophils Relative 0 %   Eosinophils Absolute 0.0 0.0 - 0.5 K/uL   Basophils Relative 0 %   Basophils Absolute 0.0 0.0 - 0.1 K/uL   Immature Granulocytes 0 %   Abs Immature Granulocytes 0.01 0.00 - 0.07 K/uL  Basic metabolic panel     Status: Abnormal   Collection Time: 01/26/19 11:23 PM  Result Value Ref Range   Sodium 138 135 - 145 mmol/L   Potassium 3.5 3.5 - 5.1 mmol/L   Chloride 103 98 - 111 mmol/L   CO2 26 22 - 32 mmol/L   Glucose, Bld 108 (H) 70 - 99 mg/dL   BUN 11 6 - 20 mg/dL   Creatinine, Ser 1.17 0.61 - 1.24 mg/dL   Calcium 9.0 8.9 - 10.3 mg/dL   GFR calc non Af Amer >60 >60 mL/min   GFR calc Af Amer >60 >60 mL/min   Anion gap 9 5 - 15  D-dimer, quantitative (not at [facility])     Status: None   Collection Time: 01/26/19 11:23 PM  Result Value Ref Range   D-Dimer, Quant 0.41 0.00 - 0.50 ug/mL-FEU   Imaging Studies: DG Chest 2 View  Result Date: 01/26/2019 CLINICAL DATA:   Shortness of breath. Chest soreness and tightness. EXAM: CHEST - 2 VIEW COMPARISON:  Radiograph and CT 02/22/2018 FINDINGS: The cardiomediastinal contours are normal. The lungs are clear. Pulmonary vasculature is normal. No consolidation, pleural effusion, or pneumothorax. No acute osseous abnormalities are seen. IMPRESSION: No acute chest findings. Electronically Signed   By: Keith Rake  M.D.   On: 01/26/2019 22:40    ED COURSE and MDM  Nursing notes, initial and subsequent vitals signs, including pulse oximetry, reviewed and interpreted by myself.  Vitals:   01/26/19 1612 01/26/19 2158 01/27/19 0021 01/27/19 0022  BP: (!) 144/80 (!) 148/95 121/79   Pulse: 86 93 86   Resp: 20 18 (!) 22   Temp: 99.7 F (37.6 C) 98.3 F (36.8 C)    TempSrc: Oral     SpO2: 100% 100% 99% 96%  Weight:      Height:       Medications  albuterol (VENTOLIN HFA) 108 (90 Base) MCG/ACT inhaler 2 puff (2 puffs Inhalation Given by Other 01/27/19 0018)  pantoprazole (PROTONIX) injection 40 mg (40 mg Intravenous Given 01/27/19 0024)   1:09 AM Patient's chest x-ray is clear and his oxygen saturation is 96% on room air.  He was given an albuterol inhaler and AeroChamber and instructed in their use.  His D-dimer is within normal limits.  His initial Covid test is negative but a confirmatory PCR has been sent.  I do not see any indication for additional work-up or admission at this time.   PROCEDURES  Procedures   ED DIAGNOSES     ICD-10-CM   1. Suspected COVID-19 virus infection  Z20.822   2. Chronic GERD  K21.9        Xzaiver Vayda, MD 01/27/19 0111

## 2019-01-26 NOTE — ED Notes (Signed)
Waiting for pt to be put into room; unable to do Port CXR in waiting room

## 2019-01-26 NOTE — ED Notes (Signed)
ED Provider at bedside. 

## 2019-01-27 LAB — SARS CORONAVIRUS 2 (TAT 6-24 HRS): SARS Coronavirus 2: POSITIVE — AB

## 2019-01-27 LAB — BASIC METABOLIC PANEL
Anion gap: 9 (ref 5–15)
BUN: 11 mg/dL (ref 6–20)
CO2: 26 mmol/L (ref 22–32)
Calcium: 9 mg/dL (ref 8.9–10.3)
Chloride: 103 mmol/L (ref 98–111)
Creatinine, Ser: 1.17 mg/dL (ref 0.61–1.24)
GFR calc Af Amer: >60 mL/min (ref >60)
GFR calc non Af Amer: >60 mL/min (ref >60)
Glucose, Bld: 108 mg/dL — ABNORMAL HIGH (ref 70–99)
Potassium: 3.5 mmol/L (ref 3.5–5.1)
Sodium: 138 mmol/L (ref 135–145)

## 2019-01-27 LAB — D-DIMER, QUANTITATIVE: D-Dimer, Quant: 0.41 ug/mL-FEU (ref 0.00–0.50)

## 2019-01-27 MED ORDER — OMEPRAZOLE 20 MG PO CPDR: 20.0000 mg | DELAYED_RELEASE_CAPSULE | Freq: Every day | ORAL | 0 refills | Status: DC

## 2019-01-30 ENCOUNTER — Telehealth: Payer: Self-pay

## 2019-01-30 NOTE — Telephone Encounter (Signed)
Pt called to have covid test results mailed to his home. Sent request to nurse and explained it could take 7-10 business days.   He voices understanding.   Joycelyn Rua Hopkins

## 2020-12-30 ENCOUNTER — Encounter (HOSPITAL_COMMUNITY): Payer: Self-pay | Admitting: Emergency Medicine

## 2020-12-30 ENCOUNTER — Emergency Department (HOSPITAL_COMMUNITY): Payer: BC Managed Care – PPO

## 2020-12-30 ENCOUNTER — Inpatient Hospital Stay (HOSPITAL_COMMUNITY)
Admission: EM | Admit: 2020-12-30 | Discharge: 2021-01-01 | DRG: 871 | Disposition: A | Discharge status: Home / Self Care | Payer: BC Managed Care – PPO | Attending: Internal Medicine | Admitting: Internal Medicine

## 2020-12-30 DIAGNOSIS — E7800 Pure hypercholesterolemia, unspecified: Secondary | ICD-10-CM | POA: Diagnosis present

## 2020-12-30 DIAGNOSIS — Z7901 Long term (current) use of anticoagulants: Secondary | ICD-10-CM

## 2020-12-30 DIAGNOSIS — K279 Peptic ulcer, site unspecified, unspecified as acute or chronic, without hemorrhage or perforation: Secondary | ICD-10-CM | POA: Diagnosis present

## 2020-12-30 DIAGNOSIS — E119 Type 2 diabetes mellitus without complications: Secondary | ICD-10-CM | POA: Diagnosis present

## 2020-12-30 DIAGNOSIS — K8070 Calculus of gallbladder and bile duct without cholecystitis without obstruction: Secondary | ICD-10-CM | POA: Diagnosis present

## 2020-12-30 DIAGNOSIS — R1013 Epigastric pain: Secondary | ICD-10-CM | POA: Diagnosis not present

## 2020-12-30 DIAGNOSIS — J90 Pleural effusion, not elsewhere classified: Secondary | ICD-10-CM | POA: Diagnosis present

## 2020-12-30 DIAGNOSIS — K55059 Acute (reversible) ischemia of intestine, part and extent unspecified: Secondary | ICD-10-CM

## 2020-12-30 DIAGNOSIS — K8511 Biliary acute pancreatitis with uninfected necrosis: Secondary | ICD-10-CM | POA: Diagnosis present

## 2020-12-30 DIAGNOSIS — I10 Essential (primary) hypertension: Secondary | ICD-10-CM | POA: Diagnosis present

## 2020-12-30 DIAGNOSIS — Z880 Allergy status to penicillin: Secondary | ICD-10-CM

## 2020-12-30 DIAGNOSIS — K8591 Acute pancreatitis with uninfected necrosis, unspecified: Secondary | ICD-10-CM | POA: Diagnosis present

## 2020-12-30 DIAGNOSIS — Z79899 Other long term (current) drug therapy: Secondary | ICD-10-CM

## 2020-12-30 DIAGNOSIS — Z86718 Personal history of other venous thrombosis and embolism: Secondary | ICD-10-CM | POA: Diagnosis not present

## 2020-12-30 DIAGNOSIS — Z20822 Contact with and (suspected) exposure to covid-19: Secondary | ICD-10-CM | POA: Diagnosis present

## 2020-12-30 DIAGNOSIS — K219 Gastro-esophageal reflux disease without esophagitis: Secondary | ICD-10-CM | POA: Diagnosis present

## 2020-12-30 DIAGNOSIS — A419 Sepsis, unspecified organism: Secondary | ICD-10-CM | POA: Diagnosis present

## 2020-12-30 DIAGNOSIS — A0472 Enterocolitis due to Clostridium difficile, not specified as recurrent: Secondary | ICD-10-CM | POA: Diagnosis present

## 2020-12-30 DIAGNOSIS — Z885 Allergy status to narcotic agent status: Secondary | ICD-10-CM

## 2020-12-30 DIAGNOSIS — Z86711 Personal history of pulmonary embolism: Secondary | ICD-10-CM | POA: Diagnosis not present

## 2020-12-30 HISTORY — DX: Essential (primary) hypertension: I10

## 2020-12-30 HISTORY — DX: Pure hypercholesterolemia, unspecified: E78.00

## 2020-12-30 HISTORY — DX: Type 2 diabetes mellitus without complications: E11.9

## 2020-12-30 LAB — CBC WITH DIFFERENTIAL/PLATELET
Abs Immature Granulocytes: 0.43 10*3/uL — ABNORMAL HIGH (ref 0.00–0.07)
Basophils Absolute: 0.1 10*3/uL (ref 0.0–0.1)
Basophils Relative: 1 %
Eosinophils Absolute: 0.2 10*3/uL (ref 0.0–0.5)
Eosinophils Relative: 1 %
HCT: 42 % (ref 39.0–52.0)
Hemoglobin: 13.8 g/dL (ref 13.0–17.0)
Immature Granulocytes: 2 %
Lymphocytes Relative: 11 %
Lymphs Abs: 2 10*3/uL (ref 0.7–4.0)
MCH: 29.1 pg (ref 26.0–34.0)
MCHC: 32.9 g/dL (ref 30.0–36.0)
MCV: 88.4 fL (ref 80.0–100.0)
Monocytes Absolute: 1.4 10*3/uL — ABNORMAL HIGH (ref 0.1–1.0)
Monocytes Relative: 8 %
Neutro Abs: 14.5 10*3/uL — ABNORMAL HIGH (ref 1.7–7.7)
Neutrophils Relative %: 77 %
Platelets: 324 10*3/uL (ref 150–400)
RBC: 4.75 MIL/uL (ref 4.22–5.81)
RDW: 12.6 % (ref 11.5–15.5)
WBC: 18.6 10*3/uL — ABNORMAL HIGH (ref 4.0–10.5)
nRBC: 0 % (ref 0.0–0.2)

## 2020-12-30 LAB — COMPREHENSIVE METABOLIC PANEL
ALT: 76 U/L — ABNORMAL HIGH (ref 0–44)
AST: 48 U/L — ABNORMAL HIGH (ref 15–41)
Albumin: 2.7 g/dL — ABNORMAL LOW (ref 3.5–5.0)
Alkaline Phosphatase: 114 U/L (ref 38–126)
Anion gap: 14 (ref 5–15)
BUN: 13 mg/dL (ref 6–20)
CO2: 23 mmol/L (ref 22–32)
Calcium: 8.9 mg/dL (ref 8.9–10.3)
Chloride: 100 mmol/L (ref 98–111)
Creatinine, Ser: 1.05 mg/dL (ref 0.61–1.24)
GFR, Estimated: >60 mL/min (ref >60)
Glucose, Bld: 198 mg/dL — ABNORMAL HIGH (ref 70–99)
Potassium: 3.9 mmol/L (ref 3.5–5.1)
Sodium: 137 mmol/L (ref 135–145)
Total Bilirubin: 1.6 mg/dL — ABNORMAL HIGH (ref 0.3–1.2)
Total Protein: 5.9 g/dL — ABNORMAL LOW (ref 6.5–8.1)

## 2020-12-30 LAB — URINALYSIS, ROUTINE W REFLEX MICROSCOPIC
Bilirubin Urine: NEGATIVE
Glucose, UA: >=500 mg/dL — AB
Ketones, ur: 40 mg/dL — AB
Leukocytes,Ua: NEGATIVE
Nitrite: NEGATIVE
Protein, ur: NEGATIVE mg/dL
Specific Gravity, Urine: 1.015 (ref 1.005–1.030)
pH: 6 (ref 5.0–8.0)

## 2020-12-30 LAB — URINALYSIS, MICROSCOPIC (REFLEX): Squamous Epithelial / HPF: NONE SEEN (ref 0–5)

## 2020-12-30 LAB — LIPASE, BLOOD: Lipase: 106 U/L — ABNORMAL HIGH (ref 11–51)

## 2020-12-30 LAB — LACTIC ACID, PLASMA: Lactic Acid, Venous: 0.9 mmol/L (ref 0.5–1.9)

## 2020-12-30 MED ORDER — IOHEXOL 300 MG/ML  SOLN
100.0000 mL | Freq: Once | INTRAMUSCULAR | Status: AC | PRN
  Administered 2020-12-30: 100 mL via INTRAVENOUS

## 2020-12-30 NOTE — Consult Note (Signed)
CC: Pancreatitis  Requesting provider: Placido Sou PAC  HPI: Dakota Compton is an 45 y.o. male hx HTN, HLD, DM, GERD, PE whom presents emergency room with a history of upper abdominal pain that began 11/30.  He was treated at [facility].  We are able to review some of his records.  It appears he left AMA at 2 AM this morning.  Subsequently came to [facility], ER.  He reports that most of his abdominal symptoms including the pain he experienced when he had presented to [facility] has almost completely resolved.  He was tolerating a diet which he notes was low-fat.  Currently, denies abdominal pain, nausea, vomiting.  At [facility], does appear he did have an NG tube for period time but that that was removed.  He underwent work-up there.  He was found to have gallstones.  He denies any alcohol use.  The working diagnosis at his last hospitalization was gallstone pancreatitis with severe necrotizing features.  He underwent work-up in the emergency room here and we were subsequently asked to see.  RUQ [place] 12/23/20 -gallstones without sonographic evidence for acute cholecystitis.  Mild CBD dilation 8 to 9 mm.  Question a few nodular foci about the gallbladder wall, potentially reflecting small polyps.  MRCP 12/23/2020- acute pancreatitis with pancreatic necrosis involving a 7 and half centimeter segment of the tail the pancreas which does not enhance.  No obvious gas in the region to indicate current superinfection with gas-forming organisms, although clearly regions of pancreatic necrosis are risk for developing fulminant infection and close clinical management is recommended. Acute peripancreatic fluid collections, most of which have standard fluid signal intensity although there is some higher T1 signal intensity along the paracolic gutters which may indicate a mild hemorrhagic component Small gallstone in the gallbladder. Mild biliary dilatation although I do not see a definite  common duct stone. There is some mildly reduced sensitivity for choledocholithiasis due to motion artifact somewhat obscuring the distal CBD on the dedicated MRCP images. No substantial degree of abnormal enhancement of the biliary wall to indicate cholangitis. Trace pleural effusions passive atelectasis in the lung bases. Stable 6.8 by 4.8 cm lesion in the lateral segment left hepatic lobe compatible with benign focal nodular hyperplasia (FNH), no change from 2017. Complex but benign Bosniak category 2 cyst of the right kidney upper pole requires no further workup.  Past Medical History:  Diagnosis Date   Diabetes mellitus without complication (HCC)    DVT (deep venous thrombosis) (HCC)    GERD (gastroesophageal reflux disease)    GERD (gastroesophageal reflux disease)    Hypercholesterolemia    Hypertension    Pulmonary emboli (HCC)     Past Surgical History:  Procedure Laterality Date   URETHRA SURGERY      No family history on file.  Social:  reports that he has never smoked. He has never used smokeless tobacco. He reports that he does not drink alcohol and does not use drugs.  Allergies:  Allergies  Allergen Reactions   Morphine And Related Shortness Of Breath   Penicillins Anaphylaxis, Hives and Swelling    Did it involve swelling of the face/tongue/throat, SOB, or low BP? Yes Did it involve sudden or severe rash/hives, skin peeling, or any reaction on the inside of your mouth or nose? Yes Did you need to seek medical attention at a hospital or doctor's office? Yes When did it last happen? childhood      If all  above answers are "NO", may proceed with cephalosporin use.     Medications: I have reviewed the patient's current medications.  Results for orders placed or performed during the hospital encounter of 12/30/20 (from the past 48 hour(s))  Comprehensive metabolic panel     Status: Abnormal   Collection Time: 12/30/20  1:13 PM  Result Value Ref Range   Sodium 137  135 - 145 mmol/L   Potassium 3.9 3.5 - 5.1 mmol/L   Chloride 100 98 - 111 mmol/L   CO2 23 22 - 32 mmol/L   Glucose, Bld 198 (H) 70 - 99 mg/dL    Comment: Glucose reference range applies only to samples taken after fasting for at least 8 hours.   BUN 13 6 - 20 mg/dL   Creatinine, Ser 1.05 0.61 - 1.24 mg/dL   Calcium 8.9 8.9 - 10.3 mg/dL   Total Protein 5.9 (L) 6.5 - 8.1 g/dL   Albumin 2.7 (L) 3.5 - 5.0 g/dL   AST 48 (H) 15 - 41 U/L   ALT 76 (H) 0 - 44 U/L   Alkaline Phosphatase 114 38 - 126 U/L   Total Bilirubin 1.6 (H) 0.3 - 1.2 mg/dL   GFR, Estimated >60 >60 mL/min    Comment: (NOTE) Calculated using the CKD-EPI Creatinine Equation (2021)    Anion gap 14 5 - 15    Comment: Performed at [facility], [place] [address] [place], [place] [address]  Lipase, blood     Status: Abnormal   Collection Time: 12/30/20  1:13 PM  Result Value Ref Range   Lipase 106 (H) 11 - 51 U/L    Comment: Performed at [facility], [place] [employer] [address], [place], [place] [address]  CBC with Differential     Status: Abnormal   Collection Time: 12/30/20  1:13 PM  Result Value Ref Range   WBC 18.6 (H) 4.0 - 10.5 K/uL   RBC 4.75 4.22 - 5.81 MIL/uL   Hemoglobin 13.8 13.0 - 17.0 g/dL   HCT 42.0 39.0 - 52.0 %   MCV 88.4 80.0 - 100.0 fL   MCH 29.1 26.0 - 34.0 pg   MCHC 32.9 30.0 - 36.0 g/dL   RDW 12.6 11.5 - 15.5 %   Platelets 324 150 - 400 K/uL   nRBC 0.0 0.0 - 0.2 %   Neutrophils Relative % 77 %   Neutro Abs 14.5 (H) 1.7 - 7.7 K/uL   Lymphocytes Relative 11 %   Lymphs Abs 2.0 0.7 - 4.0 K/uL   Monocytes Relative 8 %   Monocytes Absolute 1.4 (H) 0.1 - 1.0 K/uL   Eosinophils Relative 1 %   Eosinophils Absolute 0.2 0.0 - 0.5 K/uL   Basophils Relative 1 %   Basophils Absolute 0.1 0.0 - 0.1 K/uL   Immature Granulocytes 2 %   Abs Immature Granulocytes 0.43 (H) 0.00 - 0.07 K/uL    Comment: Performed at [facility] [address] [place], [place] [address]  Lactic acid, plasma     Status: None    Collection Time: 12/30/20  1:13 PM  Result Value Ref Range   Lactic Acid, Venous 0.9 0.5 - 1.9 mmol/L    Comment: Performed at [facility] [address] [place], [place] [address]  Urinalysis, Routine w reflex microscopic Urine, Clean Catch     Status: Abnormal   Collection Time: 12/30/20  6:12 PM  Result Value Ref Range   Color, Urine YELLOW YELLOW  APPearance CLEAR CLEAR   Specific Gravity, Urine 1.015 1.005 - 1.030   pH 6.0 5.0 - 8.0   Glucose, UA >=500 (A) NEGATIVE mg/dL   Hgb urine dipstick TRACE (A) NEGATIVE   Bilirubin Urine NEGATIVE NEGATIVE   Ketones, ur 40 (A) NEGATIVE mg/dL   Protein, ur NEGATIVE NEGATIVE mg/dL   Nitrite NEGATIVE NEGATIVE   Leukocytes,Ua NEGATIVE NEGATIVE    Comment: Performed at [facility] [address] [place], [place] [address]  Urinalysis, Microscopic (reflex)     Status: Abnormal   Collection Time: 12/30/20  6:12 PM  Result Value Ref Range   RBC / HPF 0-5 0 - 5 RBC/hpf   WBC, UA 11-20 0 - 5 WBC/hpf   Bacteria, UA RARE (A) NONE SEEN   Squamous Epithelial / LPF NONE SEEN 0 - 5    Comment: Performed at [facility], [place] [address] [place], [place] [address]    CT Abdomen Pelvis W Contrast  Result Date: 12/30/2020 CLINICAL DATA:  Acute abdominal pain.  Diarrhea and pancreatitis. EXAM: CT ABDOMEN AND PELVIS WITH CONTRAST TECHNIQUE: Multidetector CT imaging of the abdomen and pelvis was performed using the standard protocol following bolus administration of intravenous contrast. CONTRAST:  177mL OMNIPAQUE IOHEXOL 300 MG/ML  SOLN COMPARISON:  CT abdomen and pelvis 12/29/2020. FINDINGS: Lower chest: There are small bilateral pleural effusions with atelectasis/airspace disease in the bilateral lower lobes, slightly decreased on the right. Hepatobiliary: Gallstones are again seen. There is no biliary ductal dilatation. Liver is within normal limits. Pancreas: Marked peripancreatic inflammation and fluid appear similar to the prior  examination compatible with acute pancreatitis. There is lack of enhancement of the pancreatic tail and portion of the body compatible with pancreatic necrosis, unchanged. There is no pancreatic ductal dilatation. Spleen: Normal in size without focal abnormality. Adrenals/Urinary Tract: There is a stable 2.1 cm superior pole cyst. Otherwise, the kidneys, adrenal glands and bladder are within normal limits. Stomach/Bowel: Stomach is within normal limits. Appendix appears normal. No evidence of bowel wall thickening, distention, or inflammatory changes. Vascular/Lymphatic: Aorta and IVC are normal in size. 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 No enlarged lymph nodes. Reproductive: Prostate is unremarkable. Other: Fluid collection adjacent to the greater curvature of the stomach appears unchanged measuring 1.8 x 3.9 cm image 3/28. There is trace free fluid in the right lower quadrant, also unchanged. There are small fat containing bilateral inguinal hernias. Musculoskeletal: No acute or significant osseous findings. IMPRESSION: 1. No significant interval change in moderate pancreatitis with necrosis involving the body and tail. 2. 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 3. Unchanged fluid collection adjacent to the greater curvature of the stomach with small amount of free fluid in the right lower quadrant. 4. Cholelithiasis. 5. Small bilateral pleural effusions and bilateral lower lobe atelectasis/airspace disease has decreased on the right. Electronically Signed   By: Ronney Asters M.D.   On: 12/30/2020 19:51    ROS - all of the below systems have been reviewed with the patient and positives are indicated with bold text General: chills, fever or night sweats Eyes: blurry vision or double vision ENT: epistaxis or sore throat Allergy/Immunology: itchy/watery eyes or nasal congestion Hematologic/Lymphatic: bleeding problems, blood  clots or swollen lymph nodes Endocrine: temperature intolerance or unexpected weight changes Breast: new or changing breast lumps or nipple discharge Resp: cough, shortness of breath, or wheezing CV: chest pain or dyspnea on exertion GI: as per  HPI GU: dysuria, trouble voiding, or hematuria MSK: joint pain or joint stiffness Neuro: TIA or stroke symptoms Derm: pruritus and skin lesion changes Psych: anxiety and depression  PE Blood pressure 126/72, pulse (!) 109, temperature 98.5 F (36.9 C), temperature source Oral, resp. rate 18, SpO2 96 %. Constitutional: NAD; conversant; no deformities Eyes: Moist conjunctiva; no lid lag; anicteric; PERRL Neck: Trachea midline; no thyromegaly Lungs: Normal respiratory effort; no tactile fremitus CV: RRR; no palpable thrills; no pitting edema GI: Abd soft, not significantly distended nor tender; no palpable hepatosplenomegaly MSK: Normal range of motion of extremities; no clubbing/cyanosis Psychiatric: Appropriate affect; alert and oriented x3 Lymphatic: No palpable cervical or axillary lymphadenopathy  Results for orders placed or performed during the hospital encounter of 12/30/20 (from the past 48 hour(s))  Comprehensive metabolic panel     Status: Abnormal   Collection Time: 12/30/20  1:13 PM  Result Value Ref Range   Sodium 137 135 - 145 mmol/L   Potassium 3.9 3.5 - 5.1 mmol/L   Chloride 100 98 - 111 mmol/L   CO2 23 22 - 32 mmol/L   Glucose, Bld 198 (H) 70 - 99 mg/dL    Comment: Glucose reference range applies only to samples taken after fasting for at least 8 hours.   BUN 13 6 - 20 mg/dL   Creatinine, Ser 1.05 0.61 - 1.24 mg/dL   Calcium 8.9 8.9 - 10.3 mg/dL   Total Protein 5.9 (L) 6.5 - 8.1 g/dL   Albumin 2.7 (L) 3.5 - 5.0 g/dL   AST 48 (H) 15 - 41 U/L   ALT 76 (H) 0 - 44 U/L   Alkaline Phosphatase 114 38 - 126 U/L   Total Bilirubin 1.6 (H) 0.3 - 1.2 mg/dL   GFR, Estimated >60 >60 mL/min    Comment: (NOTE) Calculated using the  CKD-EPI Creatinine Equation (2021)    Anion gap 14 5 - 15    Comment: Performed at [facility], [place] [address] [place], [place] [address]  Lipase, blood     Status: Abnormal   Collection Time: 12/30/20  1:13 PM  Result Value Ref Range   Lipase 106 (H) 11 - 51 U/L    Comment: Performed at [facility], [place] [address] [place], [place] [address]  CBC with Differential     Status: Abnormal   Collection Time: 12/30/20  1:13 PM  Result Value Ref Range   WBC 18.6 (H) 4.0 - 10.5 K/uL   RBC 4.75 4.22 - 5.81 MIL/uL   Hemoglobin 13.8 13.0 - 17.0 g/dL   HCT 42.0 39.0 - 52.0 %   MCV 88.4 80.0 - 100.0 fL   MCH 29.1 26.0 - 34.0 pg   MCHC 32.9 30.0 - 36.0 g/dL   RDW 12.6 11.5 - 15.5 %   Platelets 324 150 - 400 K/uL   nRBC 0.0 0.0 - 0.2 %   Neutrophils Relative % 77 %   Neutro Abs 14.5 (H) 1.7 - 7.7 K/uL   Lymphocytes Relative 11 %   Lymphs Abs 2.0 0.7 - 4.0 K/uL   Monocytes Relative 8 %   Monocytes Absolute 1.4 (H) 0.1 - 1.0 K/uL   Eosinophils Relative 1 %   Eosinophils Absolute 0.2 0.0 - 0.5 K/uL   Basophils Relative 1 %   Basophils Absolute 0.1 0.0 - 0.1 K/uL   Immature Granulocytes 2 %   Abs Immature Granulocytes 0.43 (H) 0.00 - 0.07 K/uL    Comment: Performed at [facility], [place] [address], [place], [place] [address]  Lactic acid, plasma     Status: None   Collection Time: 12/30/20  1:13 PM  Result Value Ref Range   Lactic Acid, Venous 0.9 0.5 - 1.9 mmol/L    Comment: Performed at [facility] [address] [place], [place] [address]  Urinalysis, Routine w reflex microscopic Urine, Clean Catch     Status: Abnormal   Collection Time: 12/30/20  6:12 PM  Result Value Ref Range   Color, Urine YELLOW YELLOW   APPearance CLEAR CLEAR   Specific Gravity, Urine 1.015 1.005 - 1.030   pH 6.0 5.0 - 8.0   Glucose, UA >=500 (A) NEGATIVE mg/dL   Hgb urine dipstick TRACE (A) NEGATIVE   Bilirubin Urine NEGATIVE NEGATIVE   Ketones, ur 40 (A) NEGATIVE mg/dL    Protein, ur NEGATIVE NEGATIVE mg/dL   Nitrite NEGATIVE NEGATIVE   Leukocytes,Ua NEGATIVE NEGATIVE    Comment: Performed at [facility] [address], [place], [place] [address]  Urinalysis, Microscopic (reflex)     Status: Abnormal   Collection Time: 12/30/20  6:12 PM  Result Value Ref Range   RBC / HPF 0-5 0 - 5 RBC/hpf   WBC, UA 11-20 0 - 5 WBC/hpf   Bacteria, UA RARE (A) NONE SEEN   Squamous Epithelial / LPF NONE SEEN 0 - 5    Comment: Performed at [facility], [place] [address] [place], [place] [address]    CT Abdomen Pelvis W Contrast  Result Date: 12/30/2020 CLINICAL DATA:  Acute abdominal pain.  Diarrhea and pancreatitis. EXAM: CT ABDOMEN AND PELVIS WITH CONTRAST TECHNIQUE: Multidetector CT imaging of the abdomen and pelvis was performed using the standard protocol following bolus administration of intravenous contrast. CONTRAST:  118mL OMNIPAQUE IOHEXOL 300 MG/ML  SOLN COMPARISON:  CT abdomen and pelvis 12/29/2020. FINDINGS: Lower chest: There are small bilateral pleural effusions with atelectasis/airspace disease in the bilateral lower lobes, slightly decreased on the right. Hepatobiliary: Gallstones are again seen. There is no biliary ductal dilatation. Liver is within normal limits. Pancreas: Marked peripancreatic inflammation and fluid appear similar to the prior examination compatible with acute pancreatitis. There is lack of enhancement of the pancreatic tail and portion of the body compatible with pancreatic necrosis, unchanged. There is no pancreatic ductal dilatation. Spleen: Normal in size without focal abnormality. Adrenals/Urinary Tract: There is a stable 2.1 cm superior pole cyst. Otherwise, the kidneys, adrenal glands and bladder are within normal limits. Stomach/Bowel: Stomach is within normal limits. Appendix appears normal. No evidence of bowel wall thickening, distention, or inflammatory changes. Vascular/Lymphatic: Aorta and IVC are normal in size.  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 No enlarged lymph nodes. Reproductive: Prostate is unremarkable. Other: Fluid collection adjacent to the greater curvature of the stomach appears unchanged measuring 1.8 x 3.9 cm image 3/28. There is trace free fluid in the right lower quadrant, also unchanged. There are small fat containing bilateral inguinal hernias. Musculoskeletal: No acute or significant osseous findings. IMPRESSION: 1. No significant interval change in moderate pancreatitis with necrosis involving the body and tail. 2. 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 3. Unchanged fluid collection adjacent to the greater curvature of the stomach with small amount of free fluid in the right lower quadrant. 4. Cholelithiasis. 5. Small bilateral pleural effusions and bilateral lower lobe atelectasis/airspace disease has decreased on the right. Electronically Signed   By: Warren Lacy  Dagoberto Reef M.D.   On: 12/30/2020 19:51     A/P: JOWAN FERREL is an 45 y.o. male with HTN, HLD, DM, GERD, PE with necrotizing pancreatitis presumably 2/2 gallstone pancreatitis  -Appears he was having fevers at [facility], as recently as the last 24-48 hrs... would cover with empiric abx at this point -[place] for low fat diet from our perspective -Unclear if he had ercp or not -may at least benefit from a sphincterotomy -May also need cholecystectomy but sphincterotomy may buy time and allow this severe pancreatitis to resolve vs declare itself. Cholecystectomy at this point in time carries risk of infecting what may be sterile necrosis... but if no other compelling cause, would need to happen once this all improves -We will follow with you  Nadeen Landau, MD [facility] Use [email or web address] to contact on call provider

## 2020-12-30 NOTE — ED Notes (Signed)
Pt reports no complaints.  Sts he is "just anxious."  Pt recently left AMA from [facility].

## 2020-12-30 NOTE — ED Provider Notes (Signed)
Emergency Medicine Provider Triage Evaluation Note  Dakota Compton , a 45 y.o. male  was evaluated in triage.  Pt complains of continued abdominal pain, nausea, diarrhea secondary to being diagnosed with pancreatitis.  He states that he went to [facility] on 11/29 and was admitted to the hospital for acute necrotizing pancreatitis with complicated ileus.  Significant other at bedside reports that patient had an MRI done which did show a gallstone causing symptoms.  Patient denies alcohol use.  He had an NG tube for couple days however has since progressed to solid diet.  Patient did leave AGAINST MEDICAL ADVICE from [facility] as he felt like he was not getting the appropriate care.  Significant other feels like he still needs care prompting return ED visit to this facility today.  Review of Systems  Positive: + abd pain, nausea, diarrhea Negative: - fever, vomiting  Physical Exam  BP 114/81 (BP Location: Left Arm)   Pulse (!) 125   Temp 99.5 F (37.5 C) (Oral)   Resp 20   SpO2 97%  Gen:   Awake, no distress   Resp:  Normal effort  MSK:   Moves extremities without difficulty  Other:  Abd distended. Decreased BS. + diffuse TTP.   Medical Decision Making  Medically screening exam initiated at 1:05 PM.  Appropriate orders placed.  [name] was informed that the remainder of the evaluation will be completed by another provider, this initial triage assessment does not replace that evaluation, and the importance of remaining in the ED until their evaluation is complete.     Tanda Rockers, PA-C 12/30/20 1307    Sloan Leiter, DO 12/30/20 1645

## 2020-12-30 NOTE — H&P (Signed)
History and Physical    Dakota Compton KXF:[id number] DOB: 1975-08-20 DOA: 12/30/2020  PCP: [facility] [place] Patient coming from: Home  Chief Complaint: Abdominal pain  HPI: Dakota Compton is a 45 y.o. male with medical history significant of non-insulin-dependent type II diabetes, history of DVT/PE, PUD, hyperlipidemia, hypertension presented to the ED complaining of abdominal pain, nausea, and diarrhea secondary to being diagnosed with pancreatitis.  He was admitted at [facility] on 12/23/2020 for acute necrotizing pancreatitis due to choledocholithiasis, complicated by ileus.  Patient denied any alcohol use.  Lipase was >3000 at the time of admission.  Triglycerides not elevated to pathological state.  Right upper quadrant ultrasound revealed cholelithiasis and mild dilation of the CBD.  MRCP revealed acute pancreatitis with pancreatic necrosis involving a 7.5 cm segment of the tail of the pancreas and small gallstone in the gallbladder with mild biliary dilation but no definite CBD stone seen due to motion artifact.  He was having fevers during this admission and was treated with ertapenem.  He had NG tube placed for a few days, however, later progressed to solid diet.  General surgery was consulted and did not recommend cholecystectomy during this admission given severity of pancreatitis.  He had follow-up abdominal CT with contrast done today which revealed increasing moderate pancreatitis with similar necrosis involving the tail and upstream body.  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  Also revealed small bilateral pleural effusions concerning for atelectasis versus infection.  Patient left AMA from [facility] as he felt that he was not getting appropriate care.  In the ED today, afebrile but tachycardic.  Labs showing worsening leukocytosis (WBC 18.6).  Transaminases slightly elevated (AST 48, ALT 76) but stable compared  to labs done yesterday.  T bili 1.6, alk phos normal.  Lipase improved at 106.  Lactic acid normal.  UA not strongly suggestive of infection.  CT abdomen pelvis with contrast showing no significant interval change and moderate pancreatitis with necrosis involving the body and tail.  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  Unchanged fluid collection adjacent to the greater curvature of the stomach with small amount of free fluid in the right lower quadrant.  Cholelithiasis.  Small bilateral pleural effusions and bilateral lower lobe atelectasis/airspace disease has decreased on the right.  ED PA discussed the case with vascular surgery (Dr. Standley Dakins) regarding superior mesenteric thrombus.  He recommended anticoagulation for 3 months and GI follow-up for repeat imaging and further discussion regarding long-term anticoagulation.  General surgery (Dr. Dema Severin) consulted and saw the patient.  No plan for cholecystectomy until after pancreatitis improves.  Case was discussed with GI (Dr. Henrene Pastor), will evaluate the patient in the morning.  Patient states he went to [facility] few days ago as he started having sudden onset abdominal pain after playing basketball and was vomiting.  States he has improved now and no longer having any abdominal pain or vomiting.  States he initially had an NG tube placed in the hospital but it was removed a few days ago and since then he is having multiple soft bowel movements every day.  Patient states he left [place] [facility] at 2 AM this morning but his girlfriend brought him back to the emergency room to make sure that everything was okay.  He denies fevers.  Denies alcohol use.  Reports history of right lower extremity DVT and PE in 2020 but no longer on anticoagulation.  Denies cough,  shortness of breath, or chest pain.  Review of Systems:  All systems reviewed and apart from history of presenting illness, are negative.  Past Medical History:   Diagnosis Date   Diabetes mellitus without complication ([place])    DVT (deep venous thrombosis) (HCC)    GERD (gastroesophageal reflux disease)    GERD (gastroesophageal reflux disease)    Hypercholesterolemia    Hypertension    Pulmonary emboli ([place])     Past Surgical History:  Procedure Laterality Date   URETHRA SURGERY       reports that he has never smoked. He has never used smokeless tobacco. He reports that he does not drink alcohol and does not use drugs.  Allergies  Allergen Reactions   Morphine And Related Shortness Of Breath   Penicillins Anaphylaxis, Hives and Swelling    Tolerates carbapenems Did it involve swelling of the face/tongue/throat, SOB, or low BP? Yes Did it involve sudden or severe rash/hives, skin peeling, or any reaction on the inside of your mouth or nose? Yes Did you need to seek medical attention at a hospital or doctor's office? Yes When did it last happen? childhood      If all above answers are "NO", may proceed with cephalosporin use.    Semaglutide     Other reaction(s): Chest Pain    Family History  Problem Relation Age of Onset   Clotting disorder Mother     Prior to Admission medications   Medication Sig Start Date End Date Taking? Authorizing Provider  acetaminophen (TYLENOL) 500 MG tablet Take 1,000 mg by mouth every 6 (six) hours as needed for moderate pain or headache.   Yes [provider]  APPLE CIDER VINEGAR PO Take 2 capsules by mouth daily.   Yes [provider]  aspirin 81 MG EC tablet Take 81 mg by mouth daily.   Yes [provider]  BIOTIN PO Take 1 tablet by mouth daily.   Yes [provider]  cholecalciferol (VITAMIN D) 25 MCG (1000 UT) tablet Take 1,000 Units by mouth daily.   Yes [provider]  CINNAMON PO Take 1 capsule by mouth daily.   Yes [provider]  ELDERBERRY PO Take 2 tablets by mouth daily.   Yes [provider]  gabapentin (NEURONTIN) 100 MG  capsule Take 100 mg by mouth at bedtime. 12/09/20  Yes [provider]  JARDIANCE 10 MG TABS tablet Take 10 mg by mouth daily. 12/26/20  Yes [provider]  lisinopril (ZESTRIL) 20 MG tablet Take 20 mg by mouth daily. 11/30/20  Yes [provider]  metFORMIN (GLUCOPHAGE) 500 MG tablet Take 500 mg by mouth 2 (two) times daily. 07/17/20  Yes [provider]  Misc Natural Products (TURMERIC CURCUMIN) CAPS Take 1 capsule by mouth daily.   Yes [provider]  Multiple Vitamin (MULTIVITAMIN WITH MINERALS) TABS tablet Take 1 tablet by mouth daily.   Yes [provider]  omeprazole (PRILOSEC) 20 MG capsule Take 1 capsule (20 mg total) by mouth daily. 01/27/19  Yes Molpus, John, MD  Probiotic Product (PROBIOTIC DAILY PO) Take 1 capsule by mouth daily.   Yes [provider]  rosuvastatin (CRESTOR) 10 MG tablet Take 10 mg by mouth at bedtime. 12/10/20  Yes [provider]  ciprofloxacin (CIPRO) 500 MG tablet Take 500 mg by mouth See admin instructions. Bid x 8 days 12/30/20   [provider]  metroNIDAZOLE (FLAGYL) 500 MG tablet Take 500 mg by mouth See  admin instructions. Tid x 8 days 12/30/20   [provider]  rivaroxaban (XARELTO) 20 MG TABS tablet Take 1 tablet (20 mg total) by mouth daily with supper. Patient not taking: Reported on 12/30/2020 03/22/18   Edwin Dada, MD  Rivaroxaban 15 & 20 MG TBPK Take as directed on package: Start with one $Remove'15mg'vHlZkVK$  tablet by mouth twice a day with food. On Day 22, switch to one $Remo'20mg'qZlMJ$  tablet daily with food Patient not taking: Reported on 12/30/2020 02/23/18   Edwin Dada, MD    Physical Exam: Vitals:   12/30/20 2030 12/30/20 2048 12/30/20 2154 12/31/20 0000  BP: 121/66 107/64 126/72   Pulse: (!) 102 (!) 104 (!) 109   Resp:  18 18   Temp:      TempSrc:      SpO2: 97% 97% 96%   Weight:    117 kg  Height:    '6\' 4"'$  (1.93 m)    Physical Exam Constitutional:       General: He is not in acute distress. HENT:     Head: Normocephalic and atraumatic.  Eyes:     Extraocular Movements: Extraocular movements intact.     Conjunctiva/sclera: Conjunctivae normal.  Cardiovascular:     Rate and Rhythm: Regular rhythm. Tachycardia present.     Pulses: Normal pulses.  Pulmonary:     Effort: Pulmonary effort is normal. No respiratory distress.     Breath sounds: No wheezing or rales.  Abdominal:     General: Bowel sounds are normal. There is distension.     Palpations: Abdomen is soft.     Tenderness: There is no abdominal tenderness. There is no guarding or rebound.  Musculoskeletal:        General: No swelling or tenderness.     Cervical back: Normal range of motion and neck supple.  Skin:    General: Skin is warm and dry.  Neurological:     General: No focal deficit present.     Mental Status: He is alert and oriented to person, place, and time.     Labs on Admission: I have personally reviewed following labs and imaging studies  CBC: Recent Labs  Lab 12/30/20 1313  WBC 18.6*  NEUTROABS 14.5*  HGB 13.8  HCT 42.0  MCV 88.4  PLT 629   Basic Metabolic Panel: Recent Labs  Lab 12/30/20 1313  NA 137  K 3.9  CL 100  CO2 23  GLUCOSE 198*  BUN 13  CREATININE 1.05  CALCIUM 8.9   GFR: Estimated Creatinine Clearance: 123 mL/min (by C-G formula based on SCr of 1.05 mg/dL). Liver Function Tests: Recent Labs  Lab 12/30/20 1313  AST 48*  ALT 76*  ALKPHOS 114  BILITOT 1.6*  PROT 5.9*  ALBUMIN 2.7*   Recent Labs  Lab 12/30/20 1313  LIPASE 106*   No results for input(s): AMMONIA in the last 168 hours. Coagulation Profile: No results for input(s): INR, PROTIME in the last 168 hours. Cardiac Enzymes: No results for input(s): CKTOTAL, CKMB, CKMBINDEX, TROPONINI in the last 168 hours. BNP (last 3 results) No results for input(s): PROBNP in the last 8760 hours. HbA1C: No results for input(s): HGBA1C in the last 72 hours. CBG: No  results for input(s): GLUCAP in the last 168 hours. Lipid Profile: No results for input(s): CHOL, HDL, LDLCALC, TRIG, CHOLHDL, LDLDIRECT in the last 72 hours. Thyroid Function Tests: No results for input(s): TSH, T4TOTAL, FREET4, T3FREE, THYROIDAB in the last 72 hours. Anemia Panel:  No results for input(s): VITAMINB12, FOLATE, FERRITIN, TIBC, IRON, RETICCTPCT in the last 72 hours. Urine analysis:    Component Value Date/Time   COLORURINE YELLOW 12/30/2020 1812   APPEARANCEUR CLEAR 12/30/2020 1812   LABSPEC 1.015 12/30/2020 1812   PHURINE 6.0 12/30/2020 1812   GLUCOSEU >=500 (A) 12/30/2020 1812   HGBUR TRACE (A) 12/30/2020 1812   BILIRUBINUR NEGATIVE 12/30/2020 1812   KETONESUR 40 (A) 12/30/2020 1812   PROTEINUR NEGATIVE 12/30/2020 1812   NITRITE NEGATIVE 12/30/2020 1812   LEUKOCYTESUR NEGATIVE 12/30/2020 1812    Radiological Exams on Admission: CT Abdomen Pelvis W Contrast  Result Date: 12/30/2020 CLINICAL DATA:  Acute abdominal pain.  Diarrhea and pancreatitis. EXAM: CT ABDOMEN AND PELVIS WITH CONTRAST TECHNIQUE: Multidetector CT imaging of the abdomen and pelvis was performed using the standard protocol following bolus administration of intravenous contrast. CONTRAST:  130mL OMNIPAQUE IOHEXOL 300 MG/ML  SOLN COMPARISON:  CT abdomen and pelvis 12/29/2020. FINDINGS: Lower chest: There are small bilateral pleural effusions with atelectasis/airspace disease in the bilateral lower lobes, slightly decreased on the right. Hepatobiliary: Gallstones are again seen. There is no biliary ductal dilatation. Liver is within normal limits. Pancreas: Marked peripancreatic inflammation and fluid appear similar to the prior examination compatible with acute pancreatitis. There is lack of enhancement of the pancreatic tail and portion of the body compatible with pancreatic necrosis, unchanged. There is no pancreatic ductal dilatation. Spleen: Normal in size without focal abnormality. Adrenals/Urinary Tract:  There is a stable 2.1 cm superior pole cyst. Otherwise, the kidneys, adrenal glands and bladder are within normal limits. Stomach/Bowel: Stomach is within normal limits. Appendix appears normal. No evidence of bowel wall thickening, distention, or inflammatory changes. Vascular/Lymphatic: Aorta and IVC are normal in size. 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 No enlarged lymph nodes. Reproductive: Prostate is unremarkable. Other: Fluid collection adjacent to the greater curvature of the stomach appears unchanged measuring 1.8 x 3.9 cm image 3/28. There is trace free fluid in the right lower quadrant, also unchanged. There are small fat containing bilateral inguinal hernias. Musculoskeletal: No acute or significant osseous findings. IMPRESSION: 1. No significant interval change in moderate pancreatitis with necrosis involving the body and tail. 2. 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 3. Unchanged fluid collection adjacent to the greater curvature of the stomach with small amount of free fluid in the right lower quadrant. 4. Cholelithiasis. 5. Small bilateral pleural effusions and bilateral lower lobe atelectasis/airspace disease has decreased on the right. Electronically Signed   By: Ronney Asters M.D.   On: 12/30/2020 19:51    EKG: Ordered and currently pending.  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  Patient denied any alcohol use.  Lipase was >3000 at the time of admission.  Triglycerides not elevated to pathological state. He is on  Jardiance which does have a rare side effect of pancreatitis. Right upper quadrant ultrasound revealed cholelithiasis and mild dilation of the CBD.  MRCP revealed acute pancreatitis with pancreatic necrosis involving a 7.5 cm segment of the tail of the pancreas and small gallstone in the gallbladder with mild biliary dilation but no definite CBD  stone seen due to motion artifact.  He was having fevers during this admission and was treated with ertapenem.  He had NG tube placed for a few days, however, later progressed to solid diet.  General surgery was consulted and did not recommend cholecystectomy during this admission given severity of pancreatitis.  He had follow-up abdominal CT with contrast done today which revealed increasing moderate pancreatitis with similar necrosis involving the tail and upstream body.  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  Patient left AMA from [facility] earlier today.  Labs done in the ED today showing worsening leukocytosis.  WBC 18.6 today, was 13.2 yesterday.  Meets criteria for sepsis with tachycardia and leukocytosis.  No lactic acidosis or hypotension to suggest severe sepsis.  Transaminases slightly elevated (AST 48, ALT 76) but stable compared to labs done yesterday.  T bili 1.6, alk phos normal.  Lipase improved at 106.  Repeat CT done in the ED showing no significant interval change and moderate pancreatitis with necrosis involving the body and tail.  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  Unchanged fluid collection adjacent to the greater curvature of the stomach with small amount of free fluid in the right lower quadrant.  Cholelithiasis. ED PA discussed the case with vascular surgery (Dr. Standley Dakins) regarding superior mesenteric vein thrombus.  He recommended anticoagulation for 3 months and GI follow-up for repeat imaging and further discussion regarding long-term anticoagulation.   General surgery (Dr. Dema Severin) consulted and saw the patient.  No plan for cholecystectomy until after pancreatitis improves.  Case was discussed with GI (Dr. Henrene Pastor), will evaluate the patient in the morning. -Draw blood cultures and start meropenem.  Start IV heparin for anticoagulation.  Keep n.p.o. until patient is seen by GI.  IV fluid hydration.  Monitor CBC and CMP.  Check procalcitonin level.  Small bilateral pleural effusions Seen on CT done today.  Leukocytosis likely due to necrotizing pancreatitis rather than pneumonia.  Patient is not endorsing any respiratory symptoms.  Currently satting 92% on room air.  Echo done in January 2020 showing normal systolic and diastolic function. -On antibiotic as mentioned above.  Check BNP.  Supplemental oxygen as needed to keep oxygen saturation above 92%.  Non-insulin-dependent type 2 diabetes -Sliding scale insulin sensitive every 4 hours.  Hold Jardiance and metformin.  PUD -Continue PPI  Hypertension Stable. -Continue lisinopril  Hyperlipidemia -Hold statin  DVT prophylaxis: Heparin Code Status: Full code Family Communication: No family available at this time. Disposition Plan: Status is: Inpatient  Remains inpatient appropriate because: Sepsis secondary to acute necrotizing pancreatitis  Level of care: Level of care: Progressive  The medical decision making on this patient was of high complexity and the patient is at high risk for clinical deterioration, therefore this is a level 3 visit.  Shela Leff MD [facility]  If 7PM-7AM, please contact night-coverage [email or web address]  12/31/2020, 12:59 AM

## 2020-12-30 NOTE — ED Provider Notes (Signed)
[facility] EMERGENCY DEPARTMENT Provider Note   CSN: [id number] Arrival date & time: 12/30/20  1213     History Chief Complaint  Patient presents with   Pancreatitis    Dakota Compton is a 45 y.o. male.  HPI Patient is a 45 year old male with a history of DVT/PE, GERD, hypertension, diabetes mellitus, who presents to the emergency department due to pancreatitis.  Patient was initially evaluated at [facility] and admitted on November 30 for pancreatitis.  He states that he did not like the care there so he left AMA this morning and came to [facility] emergency department.  Per records, patient was admitted with necrotizing pancreatitis that they felt was likely secondary to a gallstone versus hypertriglyceridemia which they state was only "mildly elevated".  MRCP was obtained and revealed that he had no retained gallstone.  He was slowly improving during his admission and becoming more comfortable.  NG tube was placed and output was high and noted that it was clear as he has been drinking ice chips.  He had some bowel function during the admission and was passing flatus.  They noted that he would require cholecystectomy eventually but did not want to perform this at this time due to the severity of his pancreatitis.  Patient states that for the past 2 days during his admission he has had intermittent diarrhea.  No hematochezia.  Otherwise he has no complaints at this time.  He states his abdominal pain has improved significantly.  No nausea or vomiting.  Does note a decreased appetite.  States he does not drink alcohol or do drugs.  Radiology: RUQ ultrasound Impression (12/23/20): 1. Cholelithiasis without sonographic evidence for acute cholecystitis. 2. Mild dilatation of the common bile duct up to 8-9 mm. No visible choledocholithiasis. Correlation with LFTs suggested. Additionally, finding could be further assessed with dedicated MRI/MRCP as clinically  warranted. 3. Question a few nodular foci about the gallbladder wall, potentially reflecting small polyps. Largest of these measures 6 mm. These are almost certainly benign given size, with no follow-up imaging recommended. 4. Increased echogenicity within the hepatic parenchyma, suggesting steatosis.  Chest x-ray Impression (12/23/20): Poor inspiration. Cardiomegaly and pulmonary venous hypertension. Mild patchy density at the lung bases that could be atelectasis or pneumonia.  MRCP Impression (12/23/20): 1. Acute pancreatitis with pancreatic necrosis involving a 7.5 cm segment of the tail the pancreas which does not enhance. No obvious gas in the region to indicate current superinfection with gas-forming organism, although clearly regions of pancreatic necrosis are risk of developing fulminant infection and close clinical management is recommended. 2. Acute peripancreatic fluid collections, most of which have standard fluid signal intensity although there is some higher T1 signal intensity along the paracolic gutters which may indicate a mild hemorrhagic component. 3. Small gallstone in the gallbladder. Mild biliary dilatation although I do not see a definite common duct stone. There is some mildly reduced sensitivity for choledocholithiasis due to motion artifact somewhat obscuring the distal CBD on the dedicated MRCP images. No substantial degree of abnormal enhancement of the biliary wall to indicate cholangitis. 4. Trace pleural effusions passive atelectasis in the lung bases. 5. Stable 6.8 by 4.8 cm lesion in the lateral segment left hepatic lobe compatible with benign focal nodular hyperplasia (FNH), no change from 2017. 6. Complex but benign Bosniak category 2 cyst of the right kidney upper pole requires no further workup.  Acute Portable Abdominal X-ray Impression (12/24/20): 1. Few prominent gas-filled loops of small  bowel clustered within the mid abdomen, likely reflecting ileus related  to the patient's known pancreatitis. No visible free air. 2. Shallow lung inflation with associated bibasilar opacities, likely atelectasis. 3. Probable trace bilateral pleural effusions.    Past Medical History:  Diagnosis Date   Diabetes mellitus without complication (HCC)    DVT (deep venous thrombosis) (HCC)    GERD (gastroesophageal reflux disease)    GERD (gastroesophageal reflux disease)    Hypercholesterolemia    Hypertension    Pulmonary emboli [facility])     Patient Active Problem List   Diagnosis Date Noted   DVT (deep venous thrombosis) (HCC) 02/23/2018   Acute pulmonary embolism (HCC) 02/22/2018   NAUSEA ALONE 02/21/2008   DIARRHEA 02/21/2008   ACUTE PHARYNGITIS 01/28/2008   URI 01/28/2008   GERD 01/28/2008    Past Surgical History:  Procedure Laterality Date   URETHRA SURGERY         No family history on file.  Social History   Tobacco Use   Smoking status: Never   Smokeless tobacco: Never  Vaping Use   Vaping Use: Never used  Substance Use Topics   Alcohol use: Never   Drug use: Never    Home Medications Prior to Admission medications   Medication Sig Start Date End Date Taking? Authorizing Provider  APPLE CIDER VINEGAR PO Take 2 capsules by mouth daily.    [provider]  BIOTIN PO Take 1 tablet by mouth daily.    [provider]  cholecalciferol (VITAMIN D) 25 MCG (1000 UT) tablet Take 1,000 Units by mouth daily.    [provider]  CINNAMON PO Take 1 capsule by mouth daily.    [provider]  ELDERBERRY PO Take 2 tablets by mouth daily.    [provider]  Misc Natural Products (TURMERIC CURCUMIN) CAPS Take 1 capsule by mouth daily.    [provider]  Multiple Vitamin (MULTIVITAMIN WITH MINERALS) TABS tablet Take 1 tablet by mouth daily.    [provider]  omeprazole (PRILOSEC) 20 MG capsule Take 1 capsule (20 mg total) by mouth daily. 01/27/19   Molpus, John, MD  rivaroxaban  (XARELTO) 20 MG TABS tablet Take 1 tablet (20 mg total) by mouth daily with supper. 03/22/18   Danford, Earl Lites, MD  Rivaroxaban 15 & 20 MG TBPK Take as directed on package: Start with one  tablet by mouth twice a day with food. On Day 22, switch to one  tablet daily with food 02/23/18   Danford, Earl Lites, MD    Allergies    Morphine and related and Penicillins  Review of Systems   Review of Systems  All other systems reviewed and are negative. Ten systems reviewed and are negative for acute change, except as noted in the HPI.   Physical Exam Updated Vital Signs BP 107/64 (BP Location: Right Arm)   Pulse (!) 104   Temp 98.5 F (36.9 C) (Oral)   Resp 18   SpO2 97%   Physical Exam Vitals and nursing note reviewed.  Constitutional:      General: He is not in acute distress.    Appearance: Normal appearance. He is not ill-appearing, toxic-appearing or diaphoretic.  HENT:     Head: Normocephalic and atraumatic.     Right Ear: External ear normal.     Left Ear: External ear normal.     Nose: Nose normal.     Mouth/Throat:     Mouth: Mucous membranes are moist.  Pharynx: Oropharynx is clear. No oropharyngeal exudate or posterior oropharyngeal erythema.  Eyes:     Extraocular Movements: Extraocular movements intact.  Cardiovascular:     Rate and Rhythm: Normal rate and regular rhythm.     Pulses: Normal pulses.     Heart sounds: Normal heart sounds. No murmur heard.   No friction rub. No gallop.  Pulmonary:     Effort: Pulmonary effort is normal. No respiratory distress.     Breath sounds: Normal breath sounds. No stridor. No wheezing, rhonchi or rales.  Abdominal:     General: Abdomen is flat. There is distension.     Palpations: Abdomen is soft.     Tenderness: There is no abdominal tenderness.     Comments: Abdomen is soft.  Moderately distended.  No tenderness appreciated across all 4 quadrants.  Musculoskeletal:        General: Normal range of motion.      Cervical back: Normal range of motion and neck supple. No tenderness.  Skin:    General: Skin is warm and dry.  Neurological:     General: No focal deficit present.     Mental Status: He is alert and oriented to person, place, and time.  Psychiatric:        Mood and Affect: Mood normal.        Behavior: Behavior normal.    ED Results / Procedures / Treatments   Labs (all labs ordered are listed, but only abnormal results are displayed) Labs Reviewed  COMPREHENSIVE METABOLIC PANEL - Abnormal; Notable for the following components:      Result Value   Glucose, Bld 198 (*)    Total Protein 5.9 (*)    Albumin 2.7 (*)    AST 48 (*)    ALT 76 (*)    Total Bilirubin 1.6 (*)    All other components within normal limits  LIPASE, BLOOD - Abnormal; Notable for the following components:   Lipase 106 (*)    All other components within normal limits  CBC WITH DIFFERENTIAL/PLATELET - Abnormal; Notable for the following components:   WBC 18.6 (*)    Neutro Abs 14.5 (*)    Monocytes Absolute 1.4 (*)    Abs Immature Granulocytes 0.43 (*)    All other components within normal limits  URINALYSIS, ROUTINE W REFLEX MICROSCOPIC - Abnormal; Notable for the following components:   Glucose, UA >=500 (*)    Hgb urine dipstick TRACE (*)    Ketones, ur 40 (*)    All other components within normal limits  URINALYSIS, MICROSCOPIC (REFLEX) - Abnormal; Notable for the following components:   Bacteria, UA RARE (*)    All other components within normal limits  RESP PANEL BY RT-PCR (FLU A&B, COVID) ARPGX2  LACTIC ACID, PLASMA    EKG None  Radiology CT Abdomen Pelvis W Contrast  Result Date: 12/30/2020 CLINICAL DATA:  Acute abdominal pain.  Diarrhea and pancreatitis. EXAM: CT ABDOMEN AND PELVIS WITH CONTRAST TECHNIQUE: Multidetector CT imaging of the abdomen and pelvis was performed using the standard protocol following bolus administration of intravenous contrast. CONTRAST:  OMNIPAQUE  IOHEXOL 300 MG/ML  SOLN COMPARISON:  CT abdomen and pelvis 12/29/2020. FINDINGS: Lower chest: There are small bilateral pleural effusions with atelectasis/airspace disease in the bilateral lower lobes, slightly decreased on the right. Hepatobiliary: Gallstones are again seen. There is no biliary ductal dilatation. Liver is within normal limits. Pancreas: Marked peripancreatic inflammation and fluid appear similar to the prior examination compatible with  acute pancreatitis. There is lack of enhancement of the pancreatic tail and portion of the body compatible with pancreatic necrosis, unchanged. There is no pancreatic ductal dilatation. Spleen: Normal in size without focal abnormality. Adrenals/Urinary Tract: There is a stable 2.1 cm superior pole cyst. Otherwise, the kidneys, adrenal glands and bladder are within normal limits. Stomach/Bowel: Stomach is within normal limits. Appendix appears normal. No evidence of bowel wall thickening, distention, or inflammatory changes. Vascular/Lymphatic: Aorta and IVC are normal in size. 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 No enlarged lymph nodes. Reproductive: Prostate is unremarkable. Other: Fluid collection adjacent to the greater curvature of the stomach appears unchanged measuring 1.8 x 3.9 cm image 3/28. There is trace free fluid in the right lower quadrant, also unchanged. There are small fat containing bilateral inguinal hernias. Musculoskeletal: No acute or significant osseous findings. IMPRESSION: 1. No significant interval change in moderate pancreatitis with necrosis involving the body and tail. 2. 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 3. Unchanged fluid collection adjacent to the greater curvature of the stomach with small amount of free fluid in the right lower quadrant. 4. Cholelithiasis. 5. Small bilateral pleural effusions and bilateral lower lobe atelectasis/airspace disease  has decreased on the right. Electronically Signed   By: Darliss Cheney M.D.   On: 12/30/2020 19:51    Procedures Procedures   Medications Ordered in ED Medications  iohexol (OMNIPAQUE) 300 MG/ML solution 100 mL (100 mLs Intravenous Contrast Given 12/30/20 1929)    ED Course  I have reviewed the triage vital signs and the nursing notes.  Pertinent labs & imaging results that were available during my care of the patient were reviewed by me and considered in my medical decision making (see chart for details).  Clinical Course as of 12/30/20 2135  Wed Dec 30, 2020  1759 Lipase(!): 106 Improved from greater than 3000 on November 30. [LJ]  2036 Patient discussed with Dr. Blase Mess with vascular surgery.  CT scan shows an unchanged nonocclusive superior mesenteric thrombus.  Recommends 3 months of anticoagulation and patient follow-up with GI and have repeat imaging.  GI can then determine the need for further anticoagulation. [LJ]  2058 Patient discussed with Dr. Cliffton Asters with general surgery.  Does not feel that this patient is a surgical candidate at this time.  States that if a cholecystectomy is performed it would likely be 3 to 6 months after his necrotizing pancreatitis has resolved.  Recommends GI and medicine consultation. [LJ]  2117 Patient discussed with Dr. Marina Goodell with gastroenterology.  Agrees with medical admission.  Their team will consult on the patient. [LJ]    Clinical Course User Index [LJ] Placido Sou, PA-C   MDM Rules/Calculators/A&P                          Pt is a 46 y.o. male who presents to the emergency department for evaluation of necrotizing pancreatitis as well as a nonocclusive mesenteric thrombus that was diagnosed on November 30 during an admission at [facility].  Patient left AMA earlier today from there and came to our emergency department.  Labs: CBC with a white count of 18.6, neutrophils of 14.5, monocytes 1.4, absolute immature granulocytes of  0.43. CMP with a glucose of 198, total protein of 5.9, albumin of 2.7, AST of 48, ALT of 76, total bilirubin of 1.6. UA showing glucosuria greater than 500, trace hemoglobin, 40 ketones, rare bacteria, 11-20  white blood cells. Lipase 106. Respiratory panel is pending.  Imaging: CT scan of the abdomen/pelvis with contrast with findings as noted above.  I, Placido Sou, PA-C, personally reviewed and evaluated these images and lab results as part of my medical decision-making.  Patient with improving abdominal pain.  Abdomen is moderately distended but appears to be nontender on my exam.  Lipase initially greater than 3000 during his prior admission and this is improved to 106.  MRCP was obtained during his previous admission with findings as noted in my HPI.  They felt that his symptoms were likely secondary to gallstone pancreatitis but did not note a common duct stone on his MRCP.  They noted hypertriglyceridemia but state that it was only mildly elevated at 221.  Patient discussed with vascular surgery regarding his superior mesenteric thrombus.  They recommend anticoagulation for 3 months and GI follow-up for repeat imaging and further discussion regarding long-term anticoagulation.  Patient then discussed with general surgery.  They have evaluated the patient.  They recommend resolution of his pancreatitis for 3 to 6 months before performing a cholecystectomy.  Lastly, patient discussed with Dr. Marina Goodell who is on-call for gastroenterology.  Agrees with medicine admission.  They will evaluate the patient tomorrow morning.  Plan has been discussed with the patient and he is amenable with admission.  Will discuss with the medicine team.  Note: Portions of this report may have been transcribed using voice recognition software. Every effort was made to ensure accuracy; however, inadvertent computerized transcription errors may be present.   Final Clinical Impression(s) / ED Diagnoses Final  diagnoses:  Necrotizing pancreatitis  Mesenteric embolus [facility])   Rx / DC Orders ED Discharge Orders     None        Placido Sou, PA-C 12/30/20 2135    Ernie Avena, MD 12/30/20 2145

## 2020-12-30 NOTE — ED Triage Notes (Signed)
Patient here after leaving against medical advice earlier this morning from an admission at [facility] for pancreatitis, patient states he did not feel like he was receiving appropriate care. Patient states he was also told he needs his gallbladder removed but his pancreas is too inflamed for surgery.

## 2020-12-31 ENCOUNTER — Encounter (HOSPITAL_COMMUNITY): Payer: Self-pay | Admitting: Internal Medicine

## 2020-12-31 ENCOUNTER — Other Ambulatory Visit: Payer: Self-pay

## 2020-12-31 DIAGNOSIS — K8591 Acute pancreatitis with uninfected necrosis, unspecified: Secondary | ICD-10-CM

## 2020-12-31 DIAGNOSIS — A419 Sepsis, unspecified organism: Secondary | ICD-10-CM

## 2020-12-31 DIAGNOSIS — J90 Pleural effusion, not elsewhere classified: Secondary | ICD-10-CM

## 2020-12-31 DIAGNOSIS — E119 Type 2 diabetes mellitus without complications: Secondary | ICD-10-CM

## 2020-12-31 DIAGNOSIS — K279 Peptic ulcer, site unspecified, unspecified as acute or chronic, without hemorrhage or perforation: Secondary | ICD-10-CM

## 2020-12-31 LAB — COMPREHENSIVE METABOLIC PANEL
ALT: 64 U/L — ABNORMAL HIGH (ref 0–44)
AST: 39 U/L (ref 15–41)
Albumin: 2.4 g/dL — ABNORMAL LOW (ref 3.5–5.0)
Alkaline Phosphatase: 102 U/L (ref 38–126)
Anion gap: 11 (ref 5–15)
BUN: 13 mg/dL (ref 6–20)
CO2: 23 mmol/L (ref 22–32)
Calcium: 8.2 mg/dL — ABNORMAL LOW (ref 8.9–10.3)
Chloride: 102 mmol/L (ref 98–111)
Creatinine, Ser: 1.01 mg/dL (ref 0.61–1.24)
GFR, Estimated: >60 mL/min (ref >60)
Glucose, Bld: 143 mg/dL — ABNORMAL HIGH (ref 70–99)
Potassium: 4 mmol/L (ref 3.5–5.1)
Sodium: 136 mmol/L (ref 135–145)
Total Bilirubin: 1.2 mg/dL (ref 0.3–1.2)
Total Protein: 5.5 g/dL — ABNORMAL LOW (ref 6.5–8.1)

## 2020-12-31 LAB — CBC
HCT: 39.6 % (ref 39.0–52.0)
Hemoglobin: 13.1 g/dL (ref 13.0–17.0)
MCH: 29.6 pg (ref 26.0–34.0)
MCHC: 33.1 g/dL (ref 30.0–36.0)
MCV: 89.4 fL (ref 80.0–100.0)
Platelets: 319 10*3/uL (ref 150–400)
RBC: 4.43 MIL/uL (ref 4.22–5.81)
RDW: 12.6 % (ref 11.5–15.5)
WBC: 17.4 10*3/uL — ABNORMAL HIGH (ref 4.0–10.5)
nRBC: 0 % (ref 0.0–0.2)

## 2020-12-31 LAB — RESP PANEL BY RT-PCR (FLU A&B, COVID) ARPGX2
Influenza A by PCR: NEGATIVE
Influenza B by PCR: NEGATIVE
SARS Coronavirus 2 by RT PCR: NEGATIVE

## 2020-12-31 LAB — PROTIME-INR
INR: 1.1 (ref 0.8–1.2)
Prothrombin Time: 14.6 seconds (ref 11.4–15.2)

## 2020-12-31 LAB — CBG MONITORING, ED
Glucose-Capillary: 141 mg/dL — ABNORMAL HIGH (ref 70–99)
Glucose-Capillary: 108 mg/dL — ABNORMAL HIGH (ref 70–99)
Glucose-Capillary: 160 mg/dL — ABNORMAL HIGH (ref 70–99)
Glucose-Capillary: 128 mg/dL — ABNORMAL HIGH (ref 70–99)

## 2020-12-31 LAB — GLUCOSE, CAPILLARY
Glucose-Capillary: 145 mg/dL — ABNORMAL HIGH (ref 70–99)
Glucose-Capillary: 152 mg/dL — ABNORMAL HIGH (ref 70–99)

## 2020-12-31 LAB — HEMOGLOBIN A1C
Hgb A1c MFr Bld: 7.2 % — ABNORMAL HIGH (ref 4.8–5.6)
Mean Plasma Glucose: 159.94 mg/dL

## 2020-12-31 LAB — HEPARIN LEVEL (UNFRACTIONATED)
Heparin Unfractionated: 0.1 IU/mL — ABNORMAL LOW (ref 0.30–0.70)
Heparin Unfractionated: 0.3 IU/mL (ref 0.30–0.70)
Heparin Unfractionated: 0.26 IU/mL — ABNORMAL LOW (ref 0.30–0.70)

## 2020-12-31 LAB — HIV ANTIBODY (ROUTINE TESTING W REFLEX): HIV Screen 4th Generation wRfx: NONREACTIVE

## 2020-12-31 LAB — BRAIN NATRIURETIC PEPTIDE: B Natriuretic Peptide: 30.2 pg/mL (ref 0.0–100.0)

## 2020-12-31 LAB — PROCALCITONIN: Procalcitonin: 0.53 ng/mL

## 2020-12-31 MED ORDER — INSULIN ASPART 100 UNIT/ML IJ SOLN
0.0000 [IU] | INTRAMUSCULAR | Status: DC
  Administered 2020-12-31: 2 [IU] via SUBCUTANEOUS
  Administered 2020-12-31 (×3): 1 [IU] via SUBCUTANEOUS
  Administered 2020-12-31 – 2021-01-01 (×2): 2 [IU] via SUBCUTANEOUS
  Administered 2021-01-01: 3 [IU] via SUBCUTANEOUS
  Administered 2021-01-01 (×2): 2 [IU] via SUBCUTANEOUS

## 2020-12-31 MED ORDER — FENTANYL CITRATE PF 50 MCG/ML IJ SOSY: 12.5000 ug | PREFILLED_SYRINGE | INTRAMUSCULAR | Status: DC | PRN

## 2020-12-31 MED ORDER — METHOCARBAMOL 500 MG PO TABS
500.0000 mg | ORAL_TABLET | Freq: Four times a day (QID) | ORAL | Status: DC | PRN
  Administered 2020-12-31 (×2): 500 mg via ORAL
  Filled 2020-12-31 (×2): qty 1

## 2020-12-31 MED ORDER — LACTATED RINGERS IV SOLN: INTRAVENOUS | Status: DC

## 2020-12-31 MED ORDER — HEPARIN BOLUS VIA INFUSION
3000.0000 [IU] | Freq: Once | INTRAVENOUS | Status: AC
  Administered 2020-12-31: 3000 [IU] via INTRAVENOUS
  Filled 2020-12-31: qty 3000

## 2020-12-31 MED ORDER — SODIUM CHLORIDE 0.9 % IV SOLN
1.0000 g | Freq: Three times a day (TID) | INTRAVENOUS | Status: DC
  Administered 2020-12-31 – 2021-01-01 (×4): 1 g via INTRAVENOUS
  Filled 2020-12-31 (×5): qty 1

## 2020-12-31 MED ORDER — ONDANSETRON HCL 4 MG/2ML IJ SOLN: 4.0000 mg | Freq: Four times a day (QID) | INTRAMUSCULAR | Status: DC | PRN

## 2020-12-31 MED ORDER — HEPARIN (PORCINE) 25000 UT/250ML-% IV SOLN
2400.0000 [IU]/h | INTRAVENOUS | Status: AC
  Administered 2020-12-31: 03:00:00 1800 [IU]/h via INTRAVENOUS
  Administered 2021-01-01: 2200 [IU]/h via INTRAVENOUS
  Administered 2021-01-01: 2400 [IU]/h via INTRAVENOUS
  Filled 2020-12-31 (×4): qty 250

## 2020-12-31 MED ORDER — LISINOPRIL 20 MG PO TABS
20.0000 mg | ORAL_TABLET | Freq: Every day | ORAL | Status: DC
  Administered 2020-12-31: 20 mg via ORAL
  Filled 2020-12-31: qty 1

## 2020-12-31 MED ORDER — LACTATED RINGERS IV BOLUS
1000.0000 mL | Freq: Once | INTRAVENOUS | Status: AC
Start: 2020-12-31 — End: 2020-12-31
  Administered 2020-12-31: 1000 mL via INTRAVENOUS

## 2020-12-31 MED ORDER — PANTOPRAZOLE SODIUM 40 MG PO TBEC
40.0000 mg | DELAYED_RELEASE_TABLET | Freq: Every day | ORAL | Status: DC
  Administered 2020-12-31: 40 mg via ORAL
  Filled 2020-12-31 (×2): qty 1

## 2020-12-31 MED ORDER — HEPARIN BOLUS VIA INFUSION
5000.0000 [IU] | Freq: Once | INTRAVENOUS | Status: AC
  Administered 2020-12-31: 5000 [IU] via INTRAVENOUS
  Filled 2020-12-31: qty 5000

## 2020-12-31 NOTE — Progress Notes (Signed)
Progress Note     Subjective: Pt resting this AM and denies abdominal pain or nausea. He does report some back pain and cramping in low abdomen that he feels is more musculoskeletal. He is currently having bowel function. He understands that he needs his pancreatitis to improve before he can undergo cholecystectomy. He has a lot of questions regarding diagnoses and also FMLA.   Objective: Vital signs in last 24 hours: Temp:  [98 F (36.7 C)-99.5 F (37.5 C)] 98.5 F (36.9 C) (12/07 1730) Pulse Rate:  [90-125] 101 (12/08 0945) Resp:  [15-31] 29 (12/08 0945) BP: (107-130)/(61-81) 115/66 (12/08 0945) SpO2:  [93 %-97 %] 96 % (12/08 0945) Weight:  [277 kg] 117 kg (12/08 0000)    Intake/Output from previous day: 12/07 0701 - 12/08 0700 In: 95.3 [IV Piggyback:95.3] Out: -  Intake/Output this shift: Total I/O In: -  Out: 1250 [Urine:1250]  PE: General: pleasant, WD, overweight male who is laying in bed in NAD HEENT: sclera anicteric Heart: sinus tachycardia in the low 100s Lungs: CTAB, no wheezes, rhonchi, or rales noted.  Respiratory effort nonlabored Abd: soft, NT, mild-mod distention, +BS, no masses, hernias, or organomegaly MS: all 4 extremities are symmetrical with no cyanosis, clubbing, or edema. Skin: warm and dry with no masses, lesions, or rashes Neuro: Cranial nerves 2-12 grossly intact, sensation is normal throughout Psych: A&Ox3 with an appropriate affect.    Lab Results:  Recent Labs    12/30/20 1313 12/31/20 0145  WBC 18.6* 17.4*  HGB 13.8 13.1  HCT 42.0 39.6  PLT 324 319   BMET Recent Labs    12/30/20 1313 12/31/20 0145  NA 137 136  K 3.9 4.0  CL 100 102  CO2 23 23  GLUCOSE 198* 143*  BUN 13 13  CREATININE 1.05 1.01  CALCIUM 8.9 8.2*   PT/INR Recent Labs    12/31/20 0145  LABPROT 14.6  INR 1.1   CMP     Component Value Date/Time   NA 136 12/31/2020 0145   K 4.0 12/31/2020 0145   CL 102 12/31/2020 0145   CO2 23 12/31/2020 0145    GLUCOSE 143 (H) 12/31/2020 0145   BUN 13 12/31/2020 0145   CREATININE 1.01 12/31/2020 0145   CALCIUM 8.2 (L) 12/31/2020 0145   PROT 5.5 (L) 12/31/2020 0145   ALBUMIN 2.4 (L) 12/31/2020 0145   AST 39 12/31/2020 0145   ALT 64 (H) 12/31/2020 0145   ALKPHOS 102 12/31/2020 0145   BILITOT 1.2 12/31/2020 0145   GFRNONAA >60 12/31/2020 0145   GFRAA >60 01/26/2019 2323   Lipase     Component Value Date/Time   LIPASE 106 (H) 12/30/2020 1313       Studies/Results: CT Abdomen Pelvis W Contrast  Result Date: 12/30/2020 CLINICAL DATA:  Acute abdominal pain.  Diarrhea and pancreatitis. EXAM: CT ABDOMEN AND PELVIS WITH CONTRAST TECHNIQUE: Multidetector CT imaging of the abdomen and pelvis was performed using the standard protocol following bolus administration of intravenous contrast. CONTRAST:  OMNIPAQUE IOHEXOL 300 MG/ML  SOLN COMPARISON:  CT abdomen and pelvis 12/29/2020. FINDINGS: Lower chest: There are small bilateral pleural effusions with atelectasis/airspace disease in the bilateral lower lobes, slightly decreased on the right. Hepatobiliary: Gallstones are again seen. There is no biliary ductal dilatation. Liver is within normal limits. Pancreas: Marked peripancreatic inflammation and fluid appear similar to the prior examination compatible with acute pancreatitis. There is lack of enhancement of the pancreatic tail and portion of the body compatible with  pancreatic necrosis, unchanged. There is no pancreatic ductal dilatation. Spleen: Normal in size without focal abnormality. Adrenals/Urinary Tract: There is a stable 2.1 cm superior pole cyst. Otherwise, the kidneys, adrenal glands and bladder are within normal limits. Stomach/Bowel: Stomach is within normal limits. Appendix appears normal. No evidence of bowel wall thickening, distention, or inflammatory changes. Vascular/Lymphatic: Aorta and IVC are normal in size. 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 No enlarged lymph nodes. Reproductive: Prostate is unremarkable. Other: Fluid collection adjacent to the greater curvature of the stomach appears unchanged measuring 1.8 x 3.9 cm image 3/28. There is trace free fluid in the right lower quadrant, also unchanged. There are small fat containing bilateral inguinal hernias. Musculoskeletal: No acute or significant osseous findings. IMPRESSION: 1. No significant interval change in moderate pancreatitis with necrosis involving the body and tail. 2. 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 3. Unchanged fluid collection adjacent to the greater curvature of the stomach with small amount of free fluid in the right lower quadrant. 4. Cholelithiasis. 5. Small bilateral pleural effusions and bilateral lower lobe atelectasis/airspace disease has decreased on the right. Electronically Signed   By: Darliss Cheney M.D.   On: 12/30/2020 19:51    Anti-infectives: Anti-infectives (From admission, onward)    Start     Dose/Rate Route Frequency Ordered Stop   12/31/20 0100  meropenem (MERREM) 1 g in sodium chloride 0.9 % 100 mL IVPB        1 g 200 mL/hr over 30 Minutes Intravenous Every 8 hours 12/31/20 0050          Assessment/Plan Acute necrotizing pancreatitis  Cholelithiasis  - CT 12/7 with moderate pancreatitis with necrosis involving pancreatic body and tail - WBC 17 from 18, agree with empiric abx given fever  - ok for low fat diet from surgical standpoint - GI to consult as well, defer to GI on whether ERCP with possible sphincterotomy would be beneficial at this time - will need cholecystectomy but not until patient has improved significantly from pancreatitis - no indication for acute surgical intervention Non-occlusive SMV thrombus - likely secondary to above, on heparin gtt  FEN: CLD, LR@ 125 cc/h VTE: heparin gtt ID: merrem 12/8>>  HTN HLD T2DM GERD Hx of DVT/PE after surgery in Jan 2020  LOS: 1  day    Juliet Rude, [facility] 12/31/2020, 10:14 AM Please see Amion for pager number during day hours 7:00am-4:30pm

## 2020-12-31 NOTE — Progress Notes (Signed)
ANTICOAGULATION CONSULT NOTE - Follow Up Consult  Pharmacy Consult for Heparin:  SMA thrombus (non occlusive)  Allergies  Allergen Reactions   Morphine And Related Shortness Of Breath   Penicillins Anaphylaxis, Hives and Swelling    Tolerates carbapenems Did it involve swelling of the face/tongue/throat, SOB, or low BP? Yes Did it involve sudden or severe rash/hives, skin peeling, or any reaction on the inside of your mouth or nose? Yes Did you need to seek medical attention at a hospital or doctor's office? Yes When did it last happen? childhood      If all above answers are "NO", may proceed with cephalosporin use.    Semaglutide     Other reaction(s): Chest Pain    Patient Measurements: Height: 6\' 4"  (193 cm) Weight: 117 kg (257 lb 15 oz) IBW/kg (Calculated) : 86.8 Heparin Dosing Weight: 111 kg  Vital Signs: Temp: 98.8 F (37.1 C) (12/08 1613) Temp Source: Oral (12/08 1613) BP: 106/50 (12/08 1613) Pulse Rate: 93 (12/08 1613)  Labs: Recent Labs    12/30/20 1313 12/31/20 0145 12/31/20 0723 12/31/20 1530  HGB 13.8 13.1  --   --   HCT 42.0 39.6  --   --   PLT 324 319  --   --   LABPROT  --  14.6  --   --   INR  --  1.1  --   --   HEPARINUNFRC  --   --  0.10* 0.30  CREATININE 1.05 1.01  --   --      Estimated Creatinine Clearance: 127.8 mL/min (by C-G formula based on SCr of 1.01 mg/dL).   Medical History: Past Medical History:  Diagnosis Date   Diabetes mellitus without complication (HCC)    DVT (deep venous thrombosis) (HCC)    GERD (gastroesophageal reflux disease)    GERD (gastroesophageal reflux disease)    Hypercholesterolemia    Hypertension    Pulmonary emboli (HCC)     Medications:   Assessment: 45 y.o. M presents with pancreatitis. Pt was at [facility] from 12/1-12/7 but left AMA and came to [facility] ED.    AC: To begin heparin for SMA thrombus (nonocclusive). Pt was on enoxaparin 40mg  daily for VTE prophylaxis at OSH - last dose 12/6  1700. 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 CBC ok this AM.   Heparin level 0.3 (on 2100 units/hr, lower end of range) No signs/symptoms of bleed  Goal of Therapy:  Heparin level 0.3-0.7 units/ml Monitor platelets by anticoagulation protocol: Yes   Plan: Increase heparin gtt to 2200 units/hr Will f/u heparin level in 6 hours Daily heparin level and CBC Follow up transition to oral anticoagulation when appropriate.  Thank you for allowing pharmacy to be a part of this patient's care.  2021, PharmD Clinical Pharmacist  Please check AMION for all [facility] Pharmacy numbers After 10:00 PM, call Main Pharmacy [phone]

## 2020-12-31 NOTE — Progress Notes (Signed)
PROGRESS NOTE    Dakota Compton  ERX:[id number] DOB: 24-Feb-1975 DOA: 12/30/2020 PCP: [facility]    Chief Complaint  Patient presents with   Pancreatitis    Brief Narrative:  Dakota Compton is a 45 y.o. male with medical history significant of non-insulin-dependent type II diabetes, history of DVT/PE, PUD, hyperlipidemia, hypertension presented to the ED complaining of abdominal pain, nausea, and diarrhea secondary to being diagnosed with pancreatitis.  He was admitted at [facility] on 12/23/2020 for acute necrotizing pancreatitis due to choledocholithiasis, complicated by ileus.  Patient denied any alcohol use.  Lipase was >3000 at the time of admission.  Triglycerides not elevated to pathological state.  Right upper quadrant ultrasound revealed cholelithiasis and mild dilation of the CBD.  MRCP revealed acute pancreatitis with pancreatic necrosis involving a 7.5 cm segment of the tail of the pancreas and small gallstone in the gallbladder with mild biliary dilation but no definite CBD stone seen due to motion artifact.  He was having fevers during this admission and was treated with ertapenem.  He had NG tube placed for a few days, however, later progressed to solid diet.  General surgery was consulted and did not recommend cholecystectomy during this admission given severity of pancreatitis.  He had follow-up abdominal CT with contrast done today which revealed increasing moderate pancreatitis with similar necrosis involving the tail and upstream body.  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  Also revealed small bilateral pleural effusions concerning for atelectasis versus infection.  Patient left AMA from [facility] regional as he felt that he was not getting appropriate care  Subjective:  Currently denies pain, no n/v He is wondering when he can go home  Assessment & Plan:   Principal Problem:   Acute necrotizing  pancreatitis Active Problems:   Sepsis (HCC)   Pleural effusion   Diabetes mellitus type 2, noninsulin dependent (HCC)   PUD (peptic ulcer disease)   Acute necrotizing pancreatitis /sepsis present on admission with leukocytosis, tachycardia ,tachypnea -Thought due to gallstone pancreatitis -Blood culture sent on admission, he started on meropenem  -General surgery (Dr. Cliffton Asters) consulted and saw the patient.  No plan for cholecystectomy until after pancreatitis improves.   -GI consulted, will follow recommendation.  superior mesenteric venous vein thrombosis - ED PA discussed the case with vascular surgery (Dr. Blase Mess) regarding superior mesenteric vein thrombus.  He recommended anticoagulation for 3 months and GI follow-up for repeat imaging and further discussion regarding long-term anticoagulation  Small bilateral pleural effusions   Echo done in January 2020 showing normal systolic and diastolic function. -No hypoxia, on room air Monitor volume status ,may need as needed Lasix  Non-insulin-dependent type 2 diabetes -Home medication Jardiance and metformin held -On SSI here  History of PUD Continue PPI  Hypertension  Bp stable   Hyperlipidemia Hold statin due to elevated LFT  History of DVT PE Was on Xarelto in the past   The patient's BMI is: Body mass index is 31.4 kg/m.Marland Kitchen           Unresulted Labs (From admission, onward)     Start     Ordered   01/01/21 0500  Heparin level (unfractionated)  Daily,   R      12/31/20 0049   01/01/21 0500  CBC  Daily,   R      12/31/20 0049   12/31/20 0723  Heparin level (unfractionated)  ONCE - STAT,   STAT  12/31/20 0722   12/31/20 0028  Culture, blood (routine x 2)  BLOOD CULTURE X 2,   R (with TIMED occurrences)      12/31/20 0028   12/31/20 0022  HIV Antibody (routine testing w rflx)  (HIV Antibody (Routine testing w reflex) panel)  Once,   R        12/31/20 0028              DVT prophylaxis:   Heparin  drip   Code Status: Full Family Communication: patient  Disposition:   Status is: Inpatient  Dispo: The patient is from: Left AMA from [facility]              Anticipated d/c is to: Home              Anticipated d/c date is: home in am if clears by gi and general surgery                Consultants:  Vascular surgery General surgery GI  Procedures:  none  Antimicrobials:   Anti-infectives (From admission, onward)    Start     Dose/Rate Route Frequency Ordered Stop   12/31/20 0100  meropenem (MERREM) 1 g in sodium chloride 0.9 % 100 mL IVPB        1 g 200 mL/hr over 30 Minutes Intravenous Every 8 hours 12/31/20 0050             Objective: Vitals:   12/31/20 0000 12/31/20 0145 12/31/20 0500 12/31/20 0730  BP:  113/66 113/70 113/63  Pulse:  (!) 107 97 (!) 109  Resp:  (!) 29 (!) 26 (!) 31  Temp:      TempSrc:      SpO2:  94% 95% 95%  Weight: 117 kg     Height:  (1.93 m)       Intake/Output Summary (Last 24 hours) at 12/31/2020 0756 Last data filed at 12/31/2020 0743 Gross per 24 hour  Intake 95.33 ml  Output 1000 ml  Net -904.67 ml   Filed Weights   12/31/20 0000  Weight: 117 kg    Examination:  General exam: alert, awake, communicative, NAD Respiratory system: diminished at basis, no rales, no rhonchi, no wheezing, Respiratory effort normal. Cardiovascular system:  RRR.  Gastrointestinal system: Abdomen is nondistended, soft and nontender.  Normal bowel sounds heard. Central nervous system: Alert and oriented. No focal neurological deficits. Extremities:  no edema Skin: No rashes, lesions or ulcers Psychiatry: appear slightly anxious .     Data Reviewed: I have personally reviewed following labs and imaging studies  CBC: Recent Labs  Lab 12/30/20 1313 12/31/20 0145  WBC 18.6* 17.4*  NEUTROABS 14.5*  --   HGB 13.8 13.1  HCT 42.0 39.6  MCV 88.4 89.4  PLT 324 319    Basic Metabolic Panel: Recent Labs  Lab  12/30/20 1313 12/31/20 0145  NA 137 136  K 3.9 4.0  CL 100 102  CO2 23 23  GLUCOSE 198* 143*  BUN 13 13  CREATININE 1.05 1.01  CALCIUM 8.9 8.2*    GFR: Estimated Creatinine Clearance: 127.8 mL/min (by C-G formula based on SCr of 1.01 mg/dL).  Liver Function Tests: Recent Labs  Lab 12/30/20 1313 12/31/20 0145  AST 48* 39  ALT 76* 64*  ALKPHOS 114 102  BILITOT 1.6* 1.2  PROT 5.9* 5.5*  ALBUMIN 2.7* 2.4*    CBG: Recent Labs  Lab 12/31/20 0105 12/31/20 0419  GLUCAP  141* 108*     Recent Results (from the past 240 hour(s))  Resp Panel by RT-PCR (Flu A&B, Covid) Nasopharyngeal Swab     Status: None   Collection Time: 12/30/20  9:27 PM   Specimen: Nasopharyngeal Swab; Nasopharyngeal(NP) swabs in vial transport medium  Result Value Ref Range Status   SARS Coronavirus 2 by RT PCR NEGATIVE NEGATIVE Final    Comment: (NOTE) SARS-CoV-2 target nucleic acids are NOT DETECTED.  The SARS-CoV-2 RNA is generally detectable in upper respiratory specimens during the acute phase of infection. The lowest concentration of SARS-CoV-2 viral copies this assay can detect is 138 copies/mL. A negative result does not preclude SARS-Cov-2 infection and should not be used as the sole basis for treatment or other patient management decisions. A negative result may occur with  improper specimen collection/handling, submission of specimen other than nasopharyngeal swab, presence of viral mutation(s) within the areas targeted by this assay, and inadequate number of viral copies(<138 copies/mL). A negative result must be combined with clinical observations, patient history, and epidemiological information. The expected result is Negative.  Fact Sheet for Patients:  BloggerCourse.com  Fact Sheet for Healthcare Providers:  SeriousBroker.it  This test is no t yet approved or cleared by the [place] FDA and  has been authorized for  detection and/or diagnosis of SARS-CoV-2 by FDA under an Emergency Use Authorization (EUA). This EUA will remain  in effect (meaning this test can be used) for the duration of the COVID-19 declaration under Section 564(b)(1) of the Act, 21 U.S.C.section 360bbb-3(b)(1), unless the authorization is terminated  or revoked sooner.       Influenza A by PCR NEGATIVE NEGATIVE Final   Influenza B by PCR NEGATIVE NEGATIVE Final    Comment: (NOTE) The Xpert Xpress SARS-CoV-2/FLU/RSV plus assay is intended as an aid in the diagnosis of influenza from Nasopharyngeal swab specimens and should not be used as a sole basis for treatment. Nasal washings and aspirates are unacceptable for Xpert Xpress SARS-CoV-2/FLU/RSV testing.  Fact Sheet for Patients: BloggerCourse.com  Fact Sheet for Healthcare Providers: SeriousBroker.it  This test is not yet approved or cleared by the [place] FDA and has been authorized for detection and/or diagnosis of SARS-CoV-2 by FDA under an Emergency Use Authorization (EUA). This EUA will remain in effect (meaning this test can be used) for the duration of the COVID-19 declaration under Section 564(b)(1) of the Act, 21 U.S.C. section 360bbb-3(b)(1), unless the authorization is terminated or revoked.  Performed at [facility], [address] [place], [place] [address]          Radiology Studies: CT Abdomen Pelvis W Contrast  Result Date: 12/30/2020 CLINICAL DATA:  Acute abdominal pain.  Diarrhea and pancreatitis. EXAM: CT ABDOMEN AND PELVIS WITH CONTRAST TECHNIQUE: Multidetector CT imaging of the abdomen and pelvis was performed using the standard protocol following bolus administration of intravenous contrast. CONTRAST:  OMNIPAQUE IOHEXOL 300 MG/ML  SOLN COMPARISON:  CT abdomen and pelvis 12/29/2020. FINDINGS: Lower chest: There are small bilateral pleural effusions with atelectasis/airspace  disease in the bilateral lower lobes, slightly decreased on the right. Hepatobiliary: Gallstones are again seen. There is no biliary ductal dilatation. Liver is within normal limits. Pancreas: Marked peripancreatic inflammation and fluid appear similar to the prior examination compatible with acute pancreatitis. There is lack of enhancement of the pancreatic tail and portion of the body compatible with pancreatic necrosis, unchanged. There is no pancreatic ductal dilatation. Spleen: Normal in size without focal abnormality. Adrenals/Urinary  Tract: There is a stable 2.1 cm superior pole cyst. Otherwise, the kidneys, adrenal glands and bladder are within normal limits. Stomach/Bowel: Stomach is within normal limits. Appendix appears normal. No evidence of bowel wall thickening, distention, or inflammatory changes. Vascular/Lymphatic: Aorta and IVC are normal in size. 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 No enlarged lymph nodes. Reproductive: Prostate is unremarkable. Other: Fluid collection adjacent to the greater curvature of the stomach appears unchanged measuring 1.8 x 3.9 cm image 3/28. There is trace free fluid in the right lower quadrant, also unchanged. There are small fat containing bilateral inguinal hernias. Musculoskeletal: No acute or significant osseous findings. IMPRESSION: 1. No significant interval change in moderate pancreatitis with necrosis involving the body and tail. 2. 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 3. Unchanged fluid collection adjacent to the greater curvature of the stomach with small amount of free fluid in the right lower quadrant. 4. Cholelithiasis. 5. Small bilateral pleural effusions and bilateral lower lobe atelectasis/airspace disease has decreased on the right. Electronically Signed   By: Darliss Cheney M.D.   On: 12/30/2020 19:51        Scheduled Meds:  insulin aspart  0-9 Units  Subcutaneous Q4H   lisinopril  20 mg Oral Daily   pantoprazole  40 mg Oral Daily   Continuous Infusions:  heparin 1,800 Units/hr (12/31/20 0237)   lactated ringers 125 mL/hr at 12/31/20 0155   meropenem (MERREM) IV Stopped (12/31/20 0230)     LOS: 1 day   Time spent: Greater than 50% of this time was spent in counseling, explanation of diagnosis, planning of further management, and coordination of care.   Voice Recognition Reubin Milan dictation system was used to create this note, attempts have been made to correct errors. Please contact the author with questions and/or clarifications.   Albertine Grates, MD PhD FACP [facility]  Available via Epic secure chat 7am-7pm for nonurgent issues Please page for urgent issues To page the attending provider between 7A-7P or the covering provider during after hours 7P-7A, please log into the web site [email or web address] and access using universal [place] password for that web site. If you do not have the password, please call the hospital operator.    12/31/2020, 7:56 AM

## 2020-12-31 NOTE — Progress Notes (Signed)
ANTICOAGULATION CONSULT NOTE - Follow Up Consult  Pharmacy Consult for Heparin:  SMA thrombus (non occlusive)  Allergies  Allergen Reactions   Morphine And Related Shortness Of Breath   Penicillins Anaphylaxis, Hives and Swelling    Tolerates carbapenems Did it involve swelling of the face/tongue/throat, SOB, or low BP? Yes Did it involve sudden or severe rash/hives, skin peeling, or any reaction on the inside of your mouth or nose? Yes Did you need to seek medical attention at a hospital or doctor's office? Yes When did it last happen? childhood      If all above answers are "NO", may proceed with cephalosporin use.    Semaglutide     Other reaction(s): Chest Pain    Patient Measurements: Height: 6\' 4"  (193 cm) Weight: 117 kg (257 lb 15 oz) IBW/kg (Calculated) : 86.8 Heparin Dosing Weight: 111 kg  Vital Signs: BP: 107/75 (12/08 0815) Pulse Rate: 90 (12/08 0815)  Labs: Recent Labs    12/30/20 1313 12/31/20 0145 12/31/20 0723  HGB 13.8 13.1  --   HCT 42.0 39.6  --   PLT 324 319  --   LABPROT  --  14.6  --   INR  --  1.1  --   HEPARINUNFRC  --   --  0.10*  CREATININE 1.05 1.01  --      Estimated Creatinine Clearance: 127.8 mL/min (by C-G formula based on SCr of 1.01 mg/dL).   Medical History: Past Medical History:  Diagnosis Date   Diabetes mellitus without complication (HCC)    DVT (deep venous thrombosis) (HCC)    GERD (gastroesophageal reflux disease)    GERD (gastroesophageal reflux disease)    Hypercholesterolemia    Hypertension    Pulmonary emboli (HCC)     Medications:  See electronic med rec  Assessment: 45 y.o. M presents with pancreatitis. Pt was at [facility] from 12/1-12/7 but left AMA and came to [facility] ED.    AC: To begin heparin for SMA thrombus (nonocclusive). Pt was on enoxaparin 40mg  daily for VTE prophylaxis at OSH - last dose 12/6 1700. 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 CBC ok this AM.    1st HL - 0.1, no issues with IV site per RN. No s/sx of bleeding.  Goal of Therapy:  Heparin level 0.3-0.7 units/ml Monitor platelets by anticoagulation protocol: Yes   Plan: re-bolus Heparin IV 3000 units (27 units/kg) Increase heparin gtt to 2100 units/hr (~3 units/kg/hr) Will f/u heparin level in 6 hours Daily heparin level and CBC  14/6, PharmD, [facility] Clinical Pharmacist ED RPh Phone: [phone] Main RX: [phone]

## 2020-12-31 NOTE — Progress Notes (Signed)
ANTICOAGULATION AND ANTIBIOTIC CONSULT NOTE - Initial Consult  Pharmacy Consult for Heparin and Meropenem Indication:  SMA thrombus and intra-abd infection (necrotizing pancreatitis)  Allergies  Allergen Reactions   Morphine And Related Shortness Of Breath   Penicillins Anaphylaxis, Hives and Swelling    Did it involve swelling of the face/tongue/throat, SOB, or low BP? Yes Did it involve sudden or severe rash/hives, skin peeling, or any reaction on the inside of your mouth or nose? Yes Did you need to seek medical attention at a hospital or doctor's office? Yes When did it last happen? childhood      If all above answers are "NO", may proceed with cephalosporin use.    Semaglutide     Other reaction(s): Chest Pain    Patient Measurements: Height: 6\' 4"  (193 cm) Weight: 117 kg (257 lb 15 oz) IBW/kg (Calculated) : 86.8 Heparin Dosing Weight: 111 kg  Vital Signs: Temp: 98.5 F (36.9 C) (12/07 1730) Temp Source: Oral (12/07 1730) BP: 126/72 (12/07 2154) Pulse Rate: 109 (12/07 2154)  Labs: Recent Labs    12/30/20 1313  HGB 13.8  HCT 42.0  PLT 324  CREATININE 1.05    Estimated Creatinine Clearance: 123 mL/min (by C-G formula based on SCr of 1.05 mg/dL).   Medical History: Past Medical History:  Diagnosis Date   Diabetes mellitus without complication (HCC)    DVT (deep venous thrombosis) (HCC)    GERD (gastroesophageal reflux disease)    GERD (gastroesophageal reflux disease)    Hypercholesterolemia    Hypertension    Pulmonary emboli (HCC)     Medications:  See electronic med rec  Assessment: 45 y.o. M presents with pancreatitis. Pt was at [facility] from 12/1-12/7 but left AMA and came to [facility] ED.    AC: To begin heparin for SMA thrombus (nonocclusive). Pt was on enoxaparin 40mg  daily for VTE prophylaxis at OSH - last dose 12/6 1700. 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 CBC ok on admission.  ID: To begin meropenem  for intra-abd process. Noted pt on ertapenem 12/1-12/6 at OSH.  Goal of Therapy:  Heparin level 0.3-0.7 units/ml Monitor platelets by anticoagulation protocol: Yes   Plan:  Heparin IV bolus 5000 units Heparin gtt at 1800 units/hr Will f/u heparin level in 6 hours Daily heparin level and CBC Meropenem 1gm IV q8h Will f/u renal function, micro data, and pt's clinical condition  2021, PharmD, BCPS Please see amion for complete clinical pharmacist phone list 12/31/2020,12:34 AM

## 2020-12-31 NOTE — Consult Note (Addendum)
Referring Provider: EDP Primary Care Physician:  Center, [facility] Primary Gastroenterologist:  Gentry Fitz, has had EGD and colonoscopy through [facility], Dr. Noe Gens  Reason for Consultation:  Gallstone pancreatitis  HPI: Dakota Compton is a 45 y.o. 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  He was admitted at [facility] on 12/23/2020 for acute necrotizing pancreatitis suspected due to gallstones, complicated by ileus.  Patient denied any alcohol use.  Lipase was >3000 at the time of admission.  Triglycerides 274.  Right upper quadrant ultrasound revealed cholelithiasis and mild dilation of the CBD.  MRCP revealed acute pancreatitis with pancreatic necrosis involving a 7.5 cm segment of the tail of the pancreas and small gallstone in the gallbladder with mild biliary dilation but no definite CBD stone seen due to motion artifact.  He was having fevers during this admission and was treated with ertapenem.  He had NG tube placed for a few days, however, later progressed to low fat diet, which he was tolerating.  General surgery was consulted and did not recommend cholecystectomy during that admission given severity of pancreatitis.  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  Patient left AMA from [facility] as he felt that he was not getting appropriate care.  He was home for only several hours before returning and coming to [facility] hospital.  LFTs here are normal except for ALT of 64.  Lipase here is 106.  WBC count is 17.4K.  CT of the abdomen and pelvis with contrast showed the following:  IMPRESSION: 1. No significant interval change in moderate pancreatitis with  necrosis involving the body and tail. 2. 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 3. Unchanged fluid collection adjacent to the greater curvature of the stomach with small amount of free fluid in the right lower quadrant. 4. Cholelithiasis. 5. Small bilateral pleural effusions and bilateral lower lobe atelectasis/airspace disease has decreased on the right.  MRI abdomen/MRCP from [facility] on 11/30 showed the following:  IMPRESSION:  1. Acute pancreatitis with pancreatic necrosis involving a 7.5 cm  segment of the tail the pancreas which does not enhance. No obvious  gas in the region to indicate current superinfection with  gas-forming organism, although clearly regions of pancreatic  necrosis are risk of developing fulminant infection and close  clinical management is recommended.  2. Acute peripancreatic fluid collections, most of which have  standard fluid signal intensity although there is some higher T1  signal intensity along the paracolic gutters which may indicate a  mild hemorrhagic component.  3. Small gallstone in the gallbladder. Mild biliary dilatation  although I do not see a definite common duct stone. There is some  mildly reduced sensitivity for choledocholithiasis due to motion  artifact somewhat obscuring the distal CBD on the dedicated MRCP  images. No substantial degree of abnormal enhancement of the biliary  wall to indicate cholangitis.  4. Trace pleural effusions passive atelectasis in the lung bases.  5. Stable 6.8 by 4.8 cm lesion in the lateral segment left hepatic  lobe compatible with benign focal nodular hyperplasia (FNH), no  change from 2017.  6. Complex but benign Bosniak category 2 cyst of the right kidney  upper pole requires no further workup.    He says that overall he  does feel better but is just very weak.  No further nausea and vomiting.    He reports having had a couple EGDs and colonoscopies as recently as earlier this  year with Dr. Noe Gens at [facility].  Says that he has had hemorrhoids and polyps.  Says that he had some small ulcers and then with a repeat EGD they were gone.   Past Medical History:  Diagnosis Date   Diabetes mellitus without complication (HCC)    DVT (deep venous thrombosis) (HCC)    GERD (gastroesophageal reflux disease)    GERD (gastroesophageal reflux disease)    Hypercholesterolemia    Hypertension    Pulmonary emboli (HCC)     Past Surgical History:  Procedure Laterality Date   URETHRA SURGERY      Prior to Admission medications   Medication Sig Start Date End Date Taking? Authorizing Provider  acetaminophen (TYLENOL) 500 MG tablet Take 1,000 mg by mouth every 6 (six) hours as needed for moderate pain or headache.   Yes [provider]  APPLE CIDER VINEGAR PO Take 2 capsules by mouth daily.   Yes [provider]  aspirin 81 MG EC tablet Take 81 mg by mouth daily.   Yes [provider]  BIOTIN PO Take 1 tablet by mouth daily.   Yes [provider]  cholecalciferol (VITAMIN D) 25 MCG (1000 UT) tablet Take 1,000 Units by mouth daily.   Yes [provider]  CINNAMON PO Take 1 capsule by mouth daily.   Yes [provider]  ELDERBERRY PO Take 2 tablets by mouth daily.   Yes [provider]  gabapentin (NEURONTIN) 100 MG capsule Take 100 mg by mouth at bedtime. 12/09/20  Yes [provider]  JARDIANCE 10 MG TABS tablet Take 10 mg by mouth daily. 12/26/20  Yes [provider]  lisinopril (ZESTRIL) 20 MG tablet Take 20 mg by mouth daily. 11/30/20  Yes [provider]  metFORMIN (GLUCOPHAGE) 500 MG tablet Take 500 mg by mouth 2 (two) times daily. 07/17/20  Yes [provider]  Misc Natural Products (TURMERIC CURCUMIN) CAPS Take 1 capsule by mouth daily.   Yes [provider]  Multiple Vitamin (MULTIVITAMIN WITH MINERALS) TABS tablet Take 1 tablet by mouth daily.   Yes  [provider]  omeprazole (PRILOSEC) 20 MG capsule Take 1 capsule (20 mg total) by mouth daily. 01/27/19  Yes Molpus, John, MD  Probiotic Product (PROBIOTIC DAILY PO) Take 1 capsule by mouth daily.   Yes [provider]  rosuvastatin (CRESTOR) 10 MG tablet Take 10 mg by mouth at bedtime. 12/10/20  Yes [provider]  ciprofloxacin (CIPRO) 500 MG tablet Take 500 mg by mouth See admin instructions. Bid x 8 days 12/30/20   [provider]  metroNIDAZOLE (FLAGYL) 500 MG tablet Take 500 mg by mouth See admin instructions. Tid x 8 days 12/30/20   [provider]  rivaroxaban (XARELTO) 20 MG TABS tablet Take 1 tablet (20 mg total) by mouth daily with supper. Patient not taking: Reported on 12/30/2020 03/22/18   Alberteen Sam, MD  Rivaroxaban 15 & 20 MG TBPK Take as directed on package: Start with one  tablet by mouth twice a day with food. On Day 22, switch to one  tablet daily with food Patient not taking: Reported on 12/30/2020 02/23/18   Alberteen Sam, MD    Current Facility-Administered Medications  Medication Dose Route Frequency Provider Last Rate Last Admin  heparin ADULT infusion 100 units/mL (25000 units/232mL)  2,100 Units/hr Intravenous Continuous Albertine Grates, MD 21 mL/hr at 12/31/20 0843 2,100 Units/hr at 12/31/20 0843   insulin aspart (novoLOG) injection 0-9 Units  0-9 Units Subcutaneous Q4H John Giovanni, MD   2 Units at 12/31/20 1610   lactated ringers infusion   Intravenous Continuous John Giovanni, MD 125 mL/hr at 12/31/20 0155 New Bag at 12/31/20 0155   lisinopril (ZESTRIL) tablet 20 mg  20 mg Oral Daily John Giovanni, MD   20 mg at 12/31/20 0929   meropenem (MERREM) 1 g in sodium chloride 0.9 % 100 mL IVPB  1 g Intravenous Q8H Titus Mould, RPH 200 mL/hr at 12/31/20 0937 1 g at 12/31/20 0937   pantoprazole (PROTONIX) EC tablet 40 mg  40 mg Oral Daily John Giovanni, MD   40 mg at 12/31/20 9604    Current Outpatient Medications  Medication Sig Dispense Refill   acetaminophen (TYLENOL) 500 MG tablet Take 1,000 mg by mouth every 6 (six) hours as needed for moderate pain or headache.     APPLE CIDER VINEGAR PO Take 2 capsules by mouth daily.     aspirin 81 MG EC tablet Take 81 mg by mouth daily.     BIOTIN PO Take 1 tablet by mouth daily.     cholecalciferol (VITAMIN D) 25 MCG (1000 UT) tablet Take 1,000 Units by mouth daily.     CINNAMON PO Take 1 capsule by mouth daily.     ELDERBERRY PO Take 2 tablets by mouth daily.     gabapentin (NEURONTIN) 100 MG capsule Take 100 mg by mouth at bedtime.     JARDIANCE 10 MG TABS tablet Take 10 mg by mouth daily.     lisinopril (ZESTRIL) 20 MG tablet Take 20 mg by mouth daily.     metFORMIN (GLUCOPHAGE) 500 MG tablet Take 500 mg by mouth 2 (two) times daily.     Misc Natural Products (TURMERIC CURCUMIN) CAPS Take 1 capsule by mouth daily.     Multiple Vitamin (MULTIVITAMIN WITH MINERALS) TABS tablet Take 1 tablet by mouth daily.     omeprazole (PRILOSEC) 20 MG capsule Take 1 capsule (20 mg total) by mouth daily. 30 capsule 0   Probiotic Product (PROBIOTIC DAILY PO) Take 1 capsule by mouth daily.     rosuvastatin (CRESTOR) 10 MG tablet Take 10 mg by mouth at bedtime.     ciprofloxacin (CIPRO) 500 MG tablet Take 500 mg by mouth See admin instructions. Bid x 8 days     metroNIDAZOLE (FLAGYL) 500 MG tablet Take 500 mg by mouth See admin instructions. Tid x 8 days     rivaroxaban (XARELTO) 20 MG TABS tablet Take 1 tablet (20 mg total) by mouth daily with supper. (Patient not taking: Reported on 12/30/2020) 30 tablet 2   Rivaroxaban 15 & 20 MG TBPK Take as directed on package: Start with one  tablet by mouth twice a day with food. On Day 22, switch to one  tablet daily with food (Patient not taking: Reported on 12/30/2020) 51 each 0    Allergies as of 12/30/2020 - Review Complete 12/30/2020  Allergen Reaction Noted   Morphine and related  Shortness Of Breath 12/30/2020   Penicillins Anaphylaxis, Hives, and Swelling 10/02/2007   Semaglutide  09/03/2020    Family History  Problem Relation Age of Onset   Clotting disorder Mother     Social History   Socioeconomic History   Marital status: Single  Spouse name: Not on file   Number of children: Not on file   Years of education: Not on file   Highest education level: Not on file  Occupational History   Not on file  Tobacco Use   Smoking status: Never   Smokeless tobacco: Never  Vaping Use   Vaping Use: Never used  Substance and Sexual Activity   Alcohol use: Never   Drug use: Never   Sexual activity: Not on file  Other Topics Concern   Not on file  Social History Narrative   Not on file   Social Determinants of Health   Financial Resource Strain: Not on file  Food Insecurity: Not on file  Transportation Needs: Not on file  Physical Activity: Not on file  Stress: Not on file  Social Connections: Not on file  Intimate Partner Violence: Not on file   Review of Systems: ROS is O/W negative except as mentioned in HPI.  Physical Exam: Vital signs in last 24 hours: Temp:  [98 F (36.7 C)-99.5 F (37.5 C)] 98.5 F (36.9 C) (12/07 1730) Pulse Rate:  [90-125] 90 (12/08 0815) Resp:  [15-31] 26 (12/08 0815) BP: (107-130)/(63-81) 107/75 (12/08 0815) SpO2:  [93 %-97 %] 95 % (12/08 0815) Weight:  [035 kg] 117 kg (12/08 0000)   General:  Alert, Well-developed, well-nourished, pleasant and cooperative in NAD Head:  Normocephalic and atraumatic. Eyes:  Sclera clear, no icterus.  Conjunctiva pink. Ears:  Normal auditory acuity. Mouth:  No deformity or lesions.   Lungs:  Clear throughout to auscultation.  No wheezes, crackles, or rhonchi.  Heart:  Regular rate and rhythm; no murmurs, clicks, rubs, or gallops. Abdomen:  Soft, non-distended.  BS present.  Mild epigastric TTP. Msk:  Symmetrical without gross deformities. Pulses:  Normal pulses  noted. Extremities:  Without clubbing or edema. Neurologic:  Alert and oriented x 4;  grossly normal neurologically. Skin:  Intact without significant lesions or rashes. Psych:  Alert and cooperative. Normal mood and affect.  Intake/Output from previous day: 12/07 0701 - 12/08 0700 In: 95.3 [IV Piggyback:95.3] Out: -  Intake/Output this shift: Total I/O In: -  Out: 1250 [Urine:1250]  Lab Results: Recent Labs    12/30/20 1313 12/31/20 0145  WBC 18.6* 17.4*  HGB 13.8 13.1  HCT 42.0 39.6  PLT 324 319   BMET Recent Labs    12/30/20 1313 12/31/20 0145  NA 137 136  K 3.9 4.0  CL 100 102  CO2 23 23  GLUCOSE 198* 143*  BUN 13 13  CREATININE 1.05 1.01  CALCIUM 8.9 8.2*   LFT Recent Labs    12/31/20 0145  PROT 5.5*  ALBUMIN 2.4*  AST 39  ALT 64*  ALKPHOS 102  BILITOT 1.2   PT/INR Recent Labs    12/31/20 0145  LABPROT 14.6  INR 1.1   Studies/Results: CT Abdomen Pelvis W Contrast  Result Date: 12/30/2020 CLINICAL DATA:  Acute abdominal pain.  Diarrhea and pancreatitis. EXAM: CT ABDOMEN AND PELVIS WITH CONTRAST TECHNIQUE: Multidetector CT imaging of the abdomen and pelvis was performed using the standard protocol following bolus administration of intravenous contrast. CONTRAST:  OMNIPAQUE IOHEXOL 300 MG/ML  SOLN COMPARISON:  CT abdomen and pelvis 12/29/2020. FINDINGS: Lower chest: There are small bilateral pleural effusions with atelectasis/airspace disease in the bilateral lower lobes, slightly decreased on the right. Hepatobiliary: Gallstones are again seen. There is no biliary ductal dilatation. Liver is within normal limits. Pancreas: Marked peripancreatic inflammation and fluid appear similar to  the prior examination compatible with acute pancreatitis. There is lack of enhancement of the pancreatic tail and portion of the body compatible with pancreatic necrosis, unchanged. There is no pancreatic ductal dilatation. Spleen: Normal in size without focal  abnormality. Adrenals/Urinary Tract: There is a stable 2.1 cm superior pole cyst. Otherwise, the kidneys, adrenal glands and bladder are within normal limits. Stomach/Bowel: Stomach is within normal limits. Appendix appears normal. No evidence of bowel wall thickening, distention, or inflammatory changes. Vascular/Lymphatic: Aorta and IVC are normal in size. 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 No enlarged lymph nodes. Reproductive: Prostate is unremarkable. Other: Fluid collection adjacent to the greater curvature of the stomach appears unchanged measuring 1.8 x 3.9 cm image 3/28. There is trace free fluid in the right lower quadrant, also unchanged. There are small fat containing bilateral inguinal hernias. Musculoskeletal: No acute or significant osseous findings. IMPRESSION: 1. No significant interval change in moderate pancreatitis with necrosis involving the body and tail. 2. 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 3. Unchanged fluid collection adjacent to the greater curvature of the stomach with small amount of free fluid in the right lower quadrant. 4. Cholelithiasis. 5. Small bilateral pleural effusions and bilateral lower lobe atelectasis/airspace disease has decreased on the right. Electronically Signed   By: Darliss Cheney M.D.   On: 12/30/2020 19:51    IMPRESSION:  *Acute necrotizing pancreatitis:  WBC count elevated at 17K.  Lipase improved to 106 as compared to >3000 at initially admission.  Has been started on meropenem. *Superior mesenteric vein thrombus, non-occlusive:  on IV heparin here.  PLAN: -Will for supportive care/conservative measures with IVFs, pain control, antiemetics, etc. -Ok for clear liquid diet and likely advance as tolerated. -Trend/monitor labs. -No role for ERCP at this point as it does not appear that he has any bile duct stones on imaging. -Surgery is seeing the patient and we  appreciate their input.  **Should follow-up with GI at [facility] upon discharge as they are his regular GI physicians.  Princella Pellegrini. Zehr  12/31/2020, 9:38 AM  I have taken a history, reviewed the chart and examined the patient. I performed a substantive portion of this encounter, including complete performance of at least one of the key components, in conjunction with the APP. I agree with the APP's note, impression and recommendations  45 year old male with diabetes and history of provoked DVT in 2020, admitted to [facility] with acute necrotizing gallstone pancreatitis, less than a day after leaving [place] AMA for the same diagnosis.  Patient states that he was not happy with his care there, although the only specific complaint he provided to me was that his blood sugars ran higher over there.  His symptoms of pancreatitis are much improved, with minimal abdominal discomfort and no nausea or vomiting, although he admits he does not have much of an appetite.  His CT on admission was notable for necrotizing changes of the pancreatic tail with extensive peripancreatic fluid collections, as well as nonocclusive SMV thrombosis.  Previous MRCP did not demonstrate the presence of a stone in the CBD and there was minimal CBD dilation.  His tbili is only slightly elevated.   No indication for ERCP at this time given no conclusive evidence of CBD stone on MRCP and near normal tbili Recommend continued supportive care for severe pancreatitis Can likely advance diet to low fat diet tomorrow Recommend engaging physical therapy if not done already to  limit deconditioning Consider discontinuing antibiotics if no fevers and otherwise no clinical deterioration over the next 24 hours.  CT-guided FNA of pancreas can be considered to more definitively rule out infected necrosis. Agree with treating SMV thrombus with heparin and then transitioning to oral anticoagulation for 3-6 months   Kelia Gibbon E.  Tomasa Rand, MD [facility]

## 2020-12-31 NOTE — ED Notes (Signed)
Pt resting in stretcher with call light in reach. RN updated pt on plan of care, pt verbalized understanding. No distress noted, VSS. Waiting for bed assignment.

## 2020-12-31 NOTE — ED Notes (Signed)
Pt called out saying "he had an accident on himself", I went and provided peri care to pt. He requested to put on another brief in case he had another accident. Provided pt with a clean sheet, clean chux and a clean brief. Emptied pt's urinal. He is resting comfortably at this time.

## 2021-01-01 ENCOUNTER — Other Ambulatory Visit (HOSPITAL_COMMUNITY): Payer: Self-pay

## 2021-01-01 DIAGNOSIS — A0472 Enterocolitis due to Clostridium difficile, not specified as recurrent: Secondary | ICD-10-CM

## 2021-01-01 DIAGNOSIS — R1013 Epigastric pain: Secondary | ICD-10-CM

## 2021-01-01 LAB — C DIFFICILE QUICK SCREEN W PCR REFLEX
C Diff antigen: POSITIVE — AB
C Diff toxin: NEGATIVE

## 2021-01-01 LAB — CBC WITH DIFFERENTIAL/PLATELET
Abs Immature Granulocytes: 0.22 10*3/uL — ABNORMAL HIGH (ref 0.00–0.07)
Basophils Absolute: 0.1 10*3/uL (ref 0.0–0.1)
Basophils Relative: 1 %
Eosinophils Absolute: 0.1 10*3/uL (ref 0.0–0.5)
Eosinophils Relative: 1 %
HCT: 37.7 % — ABNORMAL LOW (ref 39.0–52.0)
Hemoglobin: 12.2 g/dL — ABNORMAL LOW (ref 13.0–17.0)
Immature Granulocytes: 2 %
Lymphocytes Relative: 12 %
Lymphs Abs: 1.7 10*3/uL (ref 0.7–4.0)
MCH: 29.2 pg (ref 26.0–34.0)
MCHC: 32.4 g/dL (ref 30.0–36.0)
MCV: 90.2 fL (ref 80.0–100.0)
Monocytes Absolute: 0.8 10*3/uL (ref 0.1–1.0)
Monocytes Relative: 5 %
Neutro Abs: 12.3 10*3/uL — ABNORMAL HIGH (ref 1.7–7.7)
Neutrophils Relative %: 79 %
Platelets: 327 10*3/uL (ref 150–400)
RBC: 4.18 MIL/uL — ABNORMAL LOW (ref 4.22–5.81)
RDW: 12.7 % (ref 11.5–15.5)
WBC: 15.2 10*3/uL — ABNORMAL HIGH (ref 4.0–10.5)
nRBC: 0 % (ref 0.0–0.2)

## 2021-01-01 LAB — COMPREHENSIVE METABOLIC PANEL
ALT: 57 U/L — ABNORMAL HIGH (ref 0–44)
AST: 40 U/L (ref 15–41)
Albumin: 2.3 g/dL — ABNORMAL LOW (ref 3.5–5.0)
Alkaline Phosphatase: 104 U/L (ref 38–126)
Anion gap: 11 (ref 5–15)
BUN: 11 mg/dL (ref 6–20)
CO2: 26 mmol/L (ref 22–32)
Calcium: 7.7 mg/dL — ABNORMAL LOW (ref 8.9–10.3)
Chloride: 99 mmol/L (ref 98–111)
Creatinine, Ser: 1.02 mg/dL (ref 0.61–1.24)
GFR, Estimated: >60 mL/min (ref >60)
Glucose, Bld: 171 mg/dL — ABNORMAL HIGH (ref 70–99)
Potassium: 3.7 mmol/L (ref 3.5–5.1)
Sodium: 136 mmol/L (ref 135–145)
Total Bilirubin: 1 mg/dL (ref 0.3–1.2)
Total Protein: 5 g/dL — ABNORMAL LOW (ref 6.5–8.1)

## 2021-01-01 LAB — GLUCOSE, CAPILLARY
Glucose-Capillary: 161 mg/dL — ABNORMAL HIGH (ref 70–99)
Glucose-Capillary: 167 mg/dL — ABNORMAL HIGH (ref 70–99)
Glucose-Capillary: 188 mg/dL — ABNORMAL HIGH (ref 70–99)
Glucose-Capillary: 201 mg/dL — ABNORMAL HIGH (ref 70–99)

## 2021-01-01 LAB — CLOSTRIDIUM DIFFICILE BY PCR, REFLEXED: Toxigenic C. Difficile by PCR: POSITIVE — AB

## 2021-01-01 LAB — HEPARIN LEVEL (UNFRACTIONATED): Heparin Unfractionated: 0.39 IU/mL (ref 0.30–0.70)

## 2021-01-01 LAB — MAGNESIUM: Magnesium: 2.2 mg/dL (ref 1.7–2.4)

## 2021-01-01 MED ORDER — LISINOPRIL 20 MG PO TABS: 20.0000 mg | ORAL_TABLET | Freq: Every day | ORAL | Status: AC

## 2021-01-01 MED ORDER — APIXABAN (ELIQUIS) VTE STARTER PACK (10MG AND 5MG)
ORAL_TABLET | ORAL | 0 refills | Status: AC
  Filled 2021-01-01: qty 74, 30d supply, fill #0

## 2021-01-01 MED ORDER — FIDAXOMICIN 200 MG PO TABS
200.0000 mg | ORAL_TABLET | Freq: Two times a day (BID) | ORAL | 0 refills | Status: AC
Start: 2021-01-01 — End: 2021-01-11
  Filled 2021-01-01: qty 20, 10d supply, fill #0

## 2021-01-01 MED ORDER — ROSUVASTATIN CALCIUM 10 MG PO TABS
10.0000 mg | ORAL_TABLET | Freq: Every day | ORAL | Status: AC
Start: 2021-01-01 — End: ?

## 2021-01-01 MED ORDER — VANCOMYCIN HCL 125 MG PO CAPS
125.0000 mg | ORAL_CAPSULE | Freq: Four times a day (QID) | ORAL | Status: DC
  Administered 2021-01-01 (×3): 125 mg via ORAL
  Filled 2021-01-01 (×5): qty 1

## 2021-01-01 MED ORDER — APIXABAN 5 MG PO TABS
10.0000 mg | ORAL_TABLET | Freq: Once | ORAL | Status: AC
  Administered 2021-01-01: 10 mg via ORAL
  Filled 2021-01-01: qty 2

## 2021-01-01 NOTE — Discharge Summary (Signed)
Discharge Summary  Dakota Compton ZOX:[id number] DOB: 04-05-1975  PCP: Loura Pardon, PA  Admit date: 12/30/2020 Discharge date: 01/01/2021  Time spent: , more than 50% time spent on coordination of care.  Case discussed with gi through secure chat prior to discharge   Recommendations for Outpatient Follow-up:  F/u with PCP within a week  for hospital discharge follow up, repeat cbc/bmp at follow up  2.   pcp to monitor  cdiff /diarrhea symptoms, pcp to monitor blood thinner management, pcp to referral patient to GI and general surgery for pancreatitis and gallbladder issues at [facility]  Discharge Diagnoses:  Active Hospital Problems   Diagnosis Date Noted   Acute necrotizing pancreatitis 12/30/2020   Sepsis (HCC) 12/31/2020   Pleural effusion 12/31/2020   Diabetes mellitus type 2, noninsulin dependent (HCC) 12/31/2020   PUD (peptic ulcer disease) 12/31/2020    Resolved Hospital Problems  No resolved problems to display.    Discharge Condition: stable  Diet recommendation: carb modified/low fat diet   Filed Weights   12/31/20 0000  Weight: 117 kg    History of present illness: ( per admitting MD Dr Ulyess Blossom) Dakota Compton is a 45 y.o. male with medical history significant of non-insulin-dependent type II diabetes, history of DVT/PE, PUD, hyperlipidemia, hypertension presented to the ED complaining of abdominal pain, nausea, and diarrhea secondary to being diagnosed with pancreatitis.  He was admitted at [facility] on 12/23/2020 for acute necrotizing pancreatitis due to choledocholithiasis, complicated by ileus.  Patient denied any alcohol use.  Lipase was >3000 at the time of admission.  Triglycerides not elevated to pathological state.  Right upper quadrant ultrasound revealed cholelithiasis and mild dilation of the CBD.  MRCP revealed acute pancreatitis with pancreatic necrosis involving a 7.5 cm segment of the tail of the pancreas and  small gallstone in the gallbladder with mild biliary dilation but no definite CBD stone seen due to motion artifact.  He was having fevers during this admission and was treated with ertapenem.  He had NG tube placed for a few days, however, later progressed to solid diet.  General surgery was consulted and did not recommend cholecystectomy during this admission given severity of pancreatitis.  He had follow-up abdominal CT with contrast done today which revealed increasing moderate pancreatitis with similar necrosis involving the tail and upstream body.  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  Also revealed small bilateral pleural effusions concerning for atelectasis versus infection.  Patient left AMA from [facility] as he felt that he was not getting appropriate care  Hospital Course:  Principal Problem:   Acute necrotizing pancreatitis Active Problems:   Sepsis (HCC)   Pleural effusion   Diabetes mellitus type 2, noninsulin dependent (HCC)   PUD (peptic ulcer disease)   Acute necrotizing pancreatitis /sepsis present on admission with leukocytosis, tachycardia ,tachypnea -Thought due to gallstone pancreatitis -Blood culture sent on admission, he started on meropenem  -seen by General surgery  No plan for cholecystectomy until after pancreatitis improves, general surgery recommend patient to follow up with  general surgery at [facility] since patient 's pcp is with [facility] -GI consulted who did not think patient will need ERCP, GI donot think patient need further antibiotics, meropenem discontinued  -GI recommend patient to follow up with previous GI practice he has been following, patient states he will follow up with [facility] -patient is feeling well, reports he was tolerating regular low fat diet  at [place] prior to signing out ama, he desired to go home and follow up with his pcp   superior mesenteric venous vein thrombosis -  ED PA discussed the case with vascular surgery (Dr. Blase Mess) regarding superior mesenteric vein thrombus.  He recommended anticoagulation for 3 months and GI follow-up for repeat imaging and further discussion regarding long-term anticoagulation -seen by GI who recommend anticoagulation for 3-74months -he was on heparin drip in the hospital, transition to Eliquis  at discharge -he is to follow up with pcp and GI for further management of anticoagulation   Cdiff diarrhea Started on dificit for 10 days  D/c ppi   Small bilateral pleural effusions   Echo done in January 2020 showing normal systolic and diastolic function. 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 If you have any questions about your discharge medications or the care you received while you were in the hospital after you are discharged, you can call the unit and asked to speak with the hospitalist on call if the hospitalist that took care of you is not available. Once you are discharged, your primary care physician will handle any  further medical issues. Please note that NO REFILLS for any discharge medications will be authorized once you are discharged, as it is imperative that you return to your primary care physician (or establish a relationship with a primary care physician if you do not have one) for your aftercare needs so that they can reassess your need for medications and monitor your lab values.  Discharge Instructions     Diet general   Complete by: As directed    Carb modified /low fat diet   Increase activity slowly   Complete by: As directed       Allergies as of 01/01/2021       Reactions   Morphine And Related Shortness Of Breath   Penicillins Anaphylaxis, Hives, Swelling   Tolerates carbapenems Did it involve swelling of the face/tongue/throat, SOB, or low BP? Yes Did it involve sudden or severe rash/hives, skin peeling, or any reaction on the inside of your mouth or nose? Yes Did you need to seek medical attention at a hospital or doctor's office? Yes When did it last happen? childhood      If all above answers are "NO", may proceed with cephalosporin use.   Semaglutide    Other reaction(s): Chest Pain  Medication List     STOP taking these medications    ciprofloxacin 500 MG tablet Commonly known as: CIPRO   metroNIDAZOLE 500 MG tablet Commonly known as: FLAGYL   omeprazole 20 MG capsule Commonly known as: PRILOSEC   rivaroxaban 20 MG Tabs tablet Commonly known as: Xarelto   Rivaroxaban Stater Pack (15 mg and 20 mg) Commonly known as: XARELTO STARTER PACK       TAKE these medications    acetaminophen 500 MG tablet Commonly known as: TYLENOL Take 1,000 mg by mouth every 6 (six) hours as needed for moderate pain or headache.   APPLE CIDER VINEGAR PO Take 2 capsules by mouth daily.   aspirin 81 MG EC tablet Take 81 mg by mouth daily.   BIOTIN PO Take 1 tablet by mouth daily.   cholecalciferol 25 MCG (1000 UNIT) tablet Commonly known as: VITAMIN D Take  1,000 Units by mouth daily.   CINNAMON PO Take 1 capsule by mouth daily.   Dificid 200 MG Tabs tablet Generic drug: fidaxomicin Take 1 tablet (200 mg total) by mouth 2 (two) times daily for 10 days.   ELDERBERRY PO Take 2 tablets by mouth daily.   Eliquis DVT/PE Starter Pack Generic drug: Apixaban Starter Pack (10mg  and 5mg ) Take as directed on package: start with two-5mg  tablets twice daily for 7 days. On day 8, switch to one-5mg  tablet twice daily.   gabapentin 100 MG capsule Commonly known as: NEURONTIN Take 100 mg by mouth at bedtime.   Jardiance 10 MG Tabs tablet Generic drug: empagliflozin Take 10 mg by mouth daily.   lisinopril 20 MG tablet Commonly known as: ZESTRIL Take 1 tablet (20 mg total) by mouth daily. Hold for three days, then resume  Please check your blood pressure at home, please hold lisinopril and  call your doctor if your systolic blood pressure is less than 100. What changed: additional instructions   metFORMIN 500 MG tablet Commonly known as: GLUCOPHAGE Take 500 mg by mouth 2 (two) times daily.   multivitamin with minerals Tabs tablet Take 1 tablet by mouth daily.   PROBIOTIC DAILY PO Take 1 capsule by mouth daily.   rosuvastatin 10 MG tablet Commonly known as: CRESTOR Take 1 tablet (10 mg total) by mouth at bedtime. Please hold crestor for a week, resume when ok with your pcp What changed: additional instructions   Turmeric Curcumin Caps Take 1 capsule by mouth daily.       Allergies  Allergen Reactions   Morphine And Related Shortness Of Breath   Penicillins Anaphylaxis, Hives and Swelling    Tolerates carbapenems Did it involve swelling of the face/tongue/throat, SOB, or low BP? Yes Did it involve sudden or severe rash/hives, skin peeling, or any reaction on the inside of your mouth or nose? Yes Did you need to seek medical attention at a hospital or doctor's office? Yes When did it last happen? childhood      If all above answers  are "NO", may proceed with cephalosporin use.    Semaglutide     Other reaction(s): Chest Pain    Follow-up Information     , PA Follow up in 1 week(s).   Specialty: Physician Assistant Why: hospital discharge follow up, pcp to recheck basic labs including cbc/bmp at follow up, pcp to monitor your cdiff /diarrhea symptoms, pcp to monitor blood thinner management pcp to refer you to GI and general surgery for pancreatitis and gallbladder issues at [facility] Contact  information: [address] [place] [address] [phone]                  The results of significant diagnostics from this hospitalization (including imaging, microbiology, ancillary and laboratory) are listed below for reference.    Significant Diagnostic Studies: CT Abdomen Pelvis W Contrast  Result Date: 12/30/2020 CLINICAL DATA:  Acute abdominal pain.  Diarrhea and pancreatitis. EXAM: CT ABDOMEN AND PELVIS WITH CONTRAST TECHNIQUE: Multidetector CT imaging of the abdomen and pelvis was performed using the standard protocol following bolus administration of intravenous contrast. CONTRAST:  OMNIPAQUE IOHEXOL 300 MG/ML  SOLN COMPARISON:  CT abdomen and pelvis 12/29/2020. FINDINGS: Lower chest: There are small bilateral pleural effusions with atelectasis/airspace disease in the bilateral lower lobes, slightly decreased on the right. Hepatobiliary: Gallstones are again seen. There is no biliary ductal dilatation. Liver is within normal limits. Pancreas: Marked peripancreatic inflammation and fluid appear similar to the prior examination compatible with acute pancreatitis. There is lack of enhancement of the pancreatic tail and portion of the body compatible with pancreatic necrosis, unchanged. There is no pancreatic ductal dilatation. Spleen: Normal in size without focal abnormality. Adrenals/Urinary Tract: There is a stable 2.1 cm superior pole cyst. Otherwise, the kidneys,  adrenal glands and bladder are within normal limits. Stomach/Bowel: Stomach is within normal limits. Appendix appears normal. No evidence of bowel wall thickening, distention, or inflammatory changes. Vascular/Lymphatic: Aorta and IVC are normal in size. 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 No enlarged lymph nodes. Reproductive: Prostate is unremarkable. Other: Fluid collection adjacent to the greater curvature of the stomach appears unchanged measuring 1.8 x 3.9 cm image 3/28. There is trace free fluid in the right lower quadrant, also unchanged. There are small fat containing bilateral inguinal hernias. Musculoskeletal: No acute or significant osseous findings. IMPRESSION: 1. No significant interval change in moderate pancreatitis with necrosis involving the body and tail. 2. 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 3. Unchanged fluid collection adjacent to the greater curvature of the stomach with small amount of free fluid in the right lower quadrant. 4. Cholelithiasis. 5. Small bilateral pleural effusions and bilateral lower lobe atelectasis/airspace disease has decreased on the right. Electronically Signed   By: Darliss Cheney M.D.   On: 12/30/2020 19:51    Microbiology: Recent Results (from the past 240 hour(s))  Resp Panel by RT-PCR (Flu A&B, Covid) Nasopharyngeal Swab     Status: None   Collection Time: 12/30/20  9:27 PM   Specimen: Nasopharyngeal Swab; Nasopharyngeal(NP) swabs in vial transport medium  Result Value Ref Range Status   SARS Coronavirus 2 by RT PCR NEGATIVE NEGATIVE Final    Comment: (NOTE) SARS-CoV-2 target nucleic acids are NOT DETECTED.  The SARS-CoV-2 RNA is generally detectable in upper respiratory specimens during the acute phase of infection. The lowest concentration of SARS-CoV-2 viral copies this assay can detect is 138 copies/mL. A negative result does not preclude SARS-Cov-2 infection  and should not be used as the sole basis for treatment or other patient management decisions. A negative result may occur with  improper specimen collection/handling, submission of specimen other than nasopharyngeal swab, presence of viral mutation(s) within the areas targeted by this assay, and inadequate number of viral copies(<138 copies/mL). A negative result must be combined with clinical observations, patient history, and epidemiological information. The expected result is Negative.  Fact Sheet for Patients:  BloggerCourse.com  Fact Sheet for Healthcare Providers:  SeriousBroker.it  This test is no t yet approved or cleared by the [place] and  has been authorized for detection and/or diagnosis of SARS-CoV-2 by FDA under an Emergency Use Authorization (EUA). This EUA will remain  in effect (meaning this test can be used) for the duration of the COVID-19 declaration under Section 564(b)(1) of the Act, 21 U.S.C.section 360bbb-3(b)(1), unless the authorization is terminated  or revoked sooner.       Influenza A by PCR NEGATIVE NEGATIVE Final   Influenza B by PCR NEGATIVE NEGATIVE Final    Comment: (NOTE) The Xpert Xpress SARS-CoV-2/FLU/RSV plus assay is intended as an aid in the diagnosis of influenza from Nasopharyngeal swab specimens and should not be used as a sole basis for treatment. Nasal washings and aspirates are unacceptable for Xpert Xpress SARS-CoV-2/FLU/RSV testing.  Fact Sheet for Patients: BloggerCourse.com  Fact Sheet for Healthcare Providers: SeriousBroker.it  This test is not yet approved or cleared by the [place] FDA and has been authorized for detection and/or diagnosis of SARS-CoV-2 by FDA under an Emergency Use Authorization (EUA). This EUA will remain in effect (meaning this test can be used) for the duration of the COVID-19 declaration  under Section 564(b)(1) of the Act, 21 U.S.C. section 360bbb-3(b)(1), unless the authorization is terminated or revoked.  Performed at [facility], [address] [place], [place] [address]   Culture, blood (routine x 2)     Status: None (Preliminary result)   Collection Time: 12/31/20  1:45 AM   Specimen: BLOOD RIGHT FOREARM  Result Value Ref Range Status   Specimen Description BLOOD RIGHT FOREARM  Final   Special Requests   Final    BOTTLES DRAWN AEROBIC AND ANAEROBIC Blood Culture adequate volume   Culture   Final    NO GROWTH 1 DAY Performed at [facility], [address] [place], [place] [address]    Report Status PENDING  Incomplete  Culture, blood (routine x 2)     Status: None (Preliminary result)   Collection Time: 12/31/20  1:45 AM   Specimen: BLOOD RIGHT HAND  Result Value Ref Range Status   Specimen Description BLOOD RIGHT HAND  Final   Special Requests   Final    BOTTLES DRAWN AEROBIC AND ANAEROBIC Blood Culture adequate volume   Culture   Final    NO GROWTH 1 DAY Performed at [facility], [address], [place], [place] [address]    Report Status PENDING  Incomplete  C Difficile Quick Screen w PCR reflex     Status: Abnormal   Collection Time: 12/31/20  9:44 PM   Specimen: STOOL  Result Value Ref Range Status   C Diff antigen POSITIVE (A) NEGATIVE Final   C Diff toxin NEGATIVE NEGATIVE Final   C Diff interpretation Results are indeterminate. See PCR results.  Final    Comment: Performed at [facility], [address], [place], [place] [address]  C. Diff by PCR, Reflexed     Status: Abnormal   Collection Time: 12/31/20  9:44 PM  Result Value Ref Range Status   Toxigenic C. Difficile by PCR POSITIVE (A) NEGATIVE Final    Comment: Positive for toxigenic C. difficile with little to no toxin production. Only treat if clinical presentation suggests symptomatic illness. Performed at [facility], [address] [place],  [place] [address]      Labs: Basic Metabolic Panel: Recent Labs  Lab 12/30/20 1313 12/31/20 0145 01/01/21  0101  NA 137 136 136  K 3.9 4.0 3.7  CL 100 102 99  CO2 23 23 26   GLUCOSE 198* 143* 171*  BUN 13 13 11   CREATININE 1.05 1.01 1.02  CALCIUM 8.9 8.2* 7.7*  MG  --   --  2.2   Liver Function Tests: Recent Labs  Lab 12/30/20 1313 12/31/20 0145 01/01/21 0101  AST 48* 39 40  ALT 76* 64* 57*  ALKPHOS 114 102 104  BILITOT 1.6* 1.2 1.0  PROT 5.9* 5.5* 5.0*  ALBUMIN 2.7* 2.4* 2.3*   Recent Labs  Lab 12/30/20 1313  LIPASE 106*   No results for input(s): AMMONIA in the last 168 hours. CBC: Recent Labs  Lab 12/30/20 1313 12/31/20 0145 01/01/21 0101  WBC 18.6* 17.4* 15.2*  NEUTROABS 14.5*  --  12.3*  HGB 13.8 13.1 12.2*  HCT 42.0 39.6 37.7*  MCV 88.4 89.4 90.2  PLT 324 319 327   Cardiac Enzymes: No results for input(s): CKTOTAL, CKMB, CKMBINDEX, TROPONINI in the last 168 hours. BNP: BNP (last 3 results) Recent Labs    12/31/20 0145  BNP 30.2    ProBNP (last 3 results) No results for input(s): PROBNP in the last 8760 hours.  CBG: Recent Labs  Lab 12/31/20 2119 01/01/21 0010 01/01/21 0351 01/01/21 0725 01/01/21 1124  GLUCAP 152* 167* 201* 161* 188*       Signed:  14/09/22 MD, PhD, FACP  [facility] 01/01/2021, 1:45 PM

## 2021-01-01 NOTE — TOC Benefit Eligibility Note (Signed)
Patient Product/process development scientist completed.    The patient is currently admitted and upon discharge could be taking Eliquis 5 mg.  The current 30 day co-pay is, $50.00.   The patient is insured through [employer] Medicare Part D     Roland Earl, CPhT Pharmacy Patient Advocate Specialist [facility] Patient Advocate Team Direct Number: [phone]  Fax: [phone]

## 2021-01-01 NOTE — Progress Notes (Signed)
ANTICOAGULATION CONSULT NOTE - Follow Up Consult  Pharmacy Consult for Heparin:  SMA thrombus (non occlusive)  Allergies  Allergen Reactions   Morphine And Related Shortness Of Breath   Penicillins Anaphylaxis, Hives and Swelling    Tolerates carbapenems Did it involve swelling of the face/tongue/throat, SOB, or low BP? Yes Did it involve sudden or severe rash/hives, skin peeling, or any reaction on the inside of your mouth or nose? Yes Did you need to seek medical attention at a hospital or doctor's office? Yes When did it last happen? childhood      If all above answers are "NO", may proceed with cephalosporin use.    Semaglutide     Other reaction(s): Chest Pain    Patient Measurements: Height: 6\' 4"  (193 cm) Weight: 117 kg (257 lb 15 oz) IBW/kg (Calculated) : 86.8 Heparin Dosing Weight: 111 kg  Vital Signs: Temp: 98.4 F (36.9 C) (12/08 2030) Temp Source: Oral (12/08 2030) BP: 114/56 (12/08 2030) Pulse Rate: 94 (12/08 2030)  Labs: Recent Labs    12/30/20 1313 12/31/20 0145 12/31/20 0723 12/31/20 1530 12/31/20 2258  HGB 13.8 13.1  --   --   --   HCT 42.0 39.6  --   --   --   PLT 324 319  --   --   --   LABPROT  --  14.6  --   --   --   INR  --  1.1  --   --   --   HEPARINUNFRC  --   --  0.10* 0.30 0.26*  CREATININE 1.05 1.01  --   --   --      Estimated Creatinine Clearance: 127.8 mL/min (by C-G formula based on SCr of 1.01 mg/dL).    Assessment: 45 y.o. M presents with pancreatitis. Pt was at [facility] from 12/1-12/7 but left AMA and came to [facility] ED.  Pt on  heparin for SMA thrombus (nonocclusive). Pt was on enoxaparin 40mg  daily for VTE prophylaxis at OSH - last dose 12/6 1700. 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   Heparin level down to slightly subtherapeutic (0.26) on gtt at 2200 units/hr. No bleeding noted.  Goal of Therapy:  Heparin level 0.3-0.7 units/ml Monitor platelets by anticoagulation protocol:  Yes   Plan: Increase heparin gtt to 2400 units/hr Will f/u heparin level in 6 hours  14/6, PharmD, BCPS Please see amion for complete clinical pharmacist phone list 01/01/2021 12:10 AM

## 2021-01-01 NOTE — Discharge Instructions (Signed)
Information on my medicine - ELIQUIS (apixaban)  This medication education was reviewed with me or my healthcare representative as part of my discharge preparation.     Why was Eliquis prescribed for you? Eliquis was prescribed to treat blood clots that may have been found in the veins of your legs (deep vein thrombosis) or in your lungs (pulmonary embolism) and to reduce the risk of them occurring again.  What do You need to know about Eliquis ? The starting dose is 10 mg (two 5 mg tablets) taken TWICE daily for the FIRST SEVEN (7) DAYS, then on 01/08/21  the dose is reduced to ONE 5 mg tablet taken TWICE daily.  Eliquis may be taken with or without food.   Try to take the dose about the same time in the morning and in the evening. If you have difficulty swallowing the tablet whole please discuss with your pharmacist how to take the medication safely.  Take Eliquis exactly as prescribed and DO NOT stop taking Eliquis without talking to the doctor who prescribed the medication.  Stopping may increase your risk of developing a new blood clot.  Refill your prescription before you run out.  After discharge, you should have regular check-up appointments with your healthcare provider that is prescribing your Eliquis.    What do you do if you miss a dose? If a dose of ELIQUIS is not taken at the scheduled time, take it as soon as possible on the same day and twice-daily administration should be resumed. The dose should not be doubled to make up for a missed dose.  Important Safety Information A possible side effect of Eliquis is bleeding. You should call your healthcare provider right away if you experience any of the following: Bleeding from an injury or your nose that does not stop. Unusual colored urine (red or dark brown) or unusual colored stools (red or black). Unusual bruising for unknown reasons. A serious fall or if you hit your head (even if there is no bleeding).  Some  medicines may interact with Eliquis and might increase your risk of bleeding or clotting while on Eliquis. To help avoid this, consult your healthcare provider or pharmacist prior to using any new prescription or non-prescription medications, including herbals, vitamins, non-steroidal anti-inflammatory drugs (NSAIDs) and supplements.  This website has more information on Eliquis (apixaban): http://www.eliquis.com/eliquis/home  

## 2021-01-01 NOTE — Progress Notes (Signed)
   01/01/21 0353  Assess: MEWS Score  Temp (!) 100.5 F (38.1 C)  BP 109/64  Pulse Rate (!) 110  ECG Heart Rate (!) 101  Resp 18  Level of Consciousness Alert  SpO2 93 %  O2 Device Room Air  Assess: MEWS Score  MEWS Temp 1  MEWS Systolic 0  MEWS Pulse 1  MEWS RR 0  MEWS LOC 0  MEWS Score 2  MEWS Score Color Yellow  Assess: if the MEWS score is Yellow or Red  Were vital signs taken at a resting state? Yes  Focused Assessment No change from prior assessment  Early Detection of Sepsis Score *See Row Information* High  MEWS guidelines implemented *See Row Information* Yes  Treat  MEWS Interventions Other (Comment) (will recheck vitals and monitor)  Pain Scale 0-10  Pain Score 0  Take Vital Signs  Increase Vital Sign Frequency  Yellow: Q 2hr X 2 then Q 4hr X 2, if remains yellow, continue Q 4hrs  Escalate  MEWS: Escalate Yellow: discuss with charge nurse/RN and consider discussing with provider and RRT  Notify: Charge Nurse/RN  Name of Charge Nurse/RN Notified Chris, RN  Date Charge Nurse/RN Notified 01/01/21  Time Charge Nurse/RN Notified 0355  Document  Patient Outcome Other (Comment) (stable, remains on unit)  Progress note created (see row info)  (will recheck vitals, monitor and notify MD PRN)

## 2021-01-01 NOTE — Progress Notes (Signed)
[facility] Progress Note  Chief Complaint: Acute biliary pancreatitis  Subjective  History: Dr. Dayle Points consult from yesterday reviewed, I also discussed the case with him yesterday. Patient states his abdominal pain is minimal and he is tolerating a regular diet today without difficulty. Tested positive for C. difficile, started on oral vancomycin. On IV heparin, will be transition to [facility].  ROS: Cardiovascular:  no chest pain Respiratory: no dyspnea  The patient's Past Medical, Family and Social History were reviewed and are on file in the EMR.  Objective:  Med list reviewed  Current Facility-Administered Medications:    heparin ADULT infusion 100 units/mL (25000 units/250mL), 2,400 Units/hr, Intravenous, Continuous, Franky Macho, RPH, Last Rate: 24 mL/hr at 01/01/21 1155, 2,400 Units/hr at 01/01/21 1155   insulin aspart (novoLOG) injection 0-9 Units, 0-9 Units, Subcutaneous, Q4H, Shela Leff, MD, 2 Units at 01/01/21 1153   methocarbamol (ROBAXIN) tablet 500 mg, 500 mg, Oral, Q6H PRN, Norm Parcel, PA-C, 500 mg at 12/31/20 1928   vancomycin (VANCOCIN) capsule 125 mg, 125 mg, Oral, QID, Rise Patience, MD, 125 mg at 01/01/21 0945   Vital signs in last 24 hrs: Vitals:   01/01/21 0727 01/01/21 1126  BP: 122/67 109/68  Pulse: 99 99  Resp: 18 18  Temp: 98.9 F (37.2 C) 98.7 F (37.1 C)  SpO2: 99% 98%   Wt Readings from Last 3 Encounters:  12/31/20 117 kg  01/26/19 117.9 kg  02/23/18 118.3 kg    Physical Exam  Well-appearing HEENT: sclera anicteric, oral mucosa moist without lesions Neck: supple, no thyromegaly, JVD or lymphadenopathy Cardiac: RRR without murmurs, S1S2 heard, no peripheral edema Pulm: clear to auscultation bilaterally, normal RR and effort noted Abdomen: soft, minimal epigastric tenderness, with active bowel sounds. No guarding or palpable hepatosplenomegaly. Skin; warm and dry, no jaundice or rash  Labs:  CBC  Latest Ref Rng & Units 01/01/2021 12/31/2020 12/30/2020  WBC 4.0 - 10.5 K/uL 15.2(H) 17.4(H) 18.6(H)  Hemoglobin 13.0 - 17.0 g/dL 12.2(L) 13.1 13.8  Hematocrit 39.0 - 52.0 % 37.7(L) 39.6 42.0  Platelets 150 - 400 K/uL 327 319 324   CMP Latest Ref Rng & Units 01/01/2021 12/31/2020 12/30/2020  Glucose 70 - 99 mg/dL 171(H) 143(H) 198(H)  BUN 6 - 20 mg/dL 11 13 13   Creatinine 0.61 - 1.24 mg/dL 1.02 1.01 1.05  Sodium 135 - 145 mmol/L 136 136 137  Potassium 3.5 - 5.1 mmol/L 3.7 4.0 3.9  Chloride 98 - 111 mmol/L 99 102 100  CO2 22 - 32 mmol/L 26 23 23   Calcium 8.9 - 10.3 mg/dL 7.7(L) 8.2(L) 8.9  Total Protein 6.5 - 8.1 g/dL 5.0(L) 5.5(L) 5.9(L)  Total Bilirubin 0.3 - 1.2 mg/dL 1.0 1.2 1.6(H)  Alkaline Phos 38 - 126 U/L 104 102 114  AST 15 - 41 U/L 40 39 48(H)  ALT 0 - 44 U/L 57(H) 64(H) 76(H)   C. 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 difficile associated diarrhea, likely related to antibiotics given for concern of infected pancreatic necrosis.  Patient does not appear septic, I suspect he probably does not have any ongoing infection in the necrotic pancreatic tissue, if he had any at all.  Patient is intent on going home today, and tried physician confirms that is the plan.  He appears  to have good pain control, is on oral anticoagulant, C. difficile being treated, he is able to take oral nutrition, so discharge is okay from my standpoint.  Plan: This patient has a GI physician at [facility] with whom he should follow-up in the near future for ongoing management of his pancreatitis and coordination of care with eventual  surgical evaluation for cholecystectomy.    Charlie Pitter III

## 2021-01-01 NOTE — Progress Notes (Addendum)
ANTICOAGULATION CONSULT NOTE - Follow Up Consult  Pharmacy Consult for Heparin Indication:  SMA thrombus (non-occlusive)  Allergies  Allergen Reactions   Morphine And Related Shortness Of Breath   Penicillins Anaphylaxis, Hives and Swelling    Tolerates carbapenems Did it involve swelling of the face/tongue/throat, SOB, or low BP? Yes Did it involve sudden or severe rash/hives, skin peeling, or any reaction on the inside of your mouth or nose? Yes Did you need to seek medical attention at a hospital or doctor's office? Yes When did it last happen? childhood      If all above answers are "NO", may proceed with cephalosporin use.    Semaglutide     Other reaction(s): Chest Pain    Patient Measurements: Height: 6\' 4"  (193 cm) Weight: 117 kg (257 lb 15 oz) IBW/kg (Calculated) : 86.8 Heparin Dosing Weight: 111 kg  Vital Signs: Temp: 98.9 F (37.2 C) (12/09 0727) Temp Source: Oral (12/09 0727) BP: 122/67 (12/09 0727) Pulse Rate: 99 (12/09 0727)  Labs: Recent Labs    12/30/20 1313 12/31/20 0145 12/31/20 0723 12/31/20 1530 12/31/20 2258 01/01/21 0101 01/01/21 0709  HGB 13.8 13.1  --   --   --  12.2*  --   HCT 42.0 39.6  --   --   --  37.7*  --   PLT 324 319  --   --   --  327  --   LABPROT  --  14.6  --   --   --   --   --   INR  --  1.1  --   --   --   --   --   HEPARINUNFRC  --   --    < > 0.30 0.26*  --  0.39  CREATININE 1.05 1.01  --   --   --  1.02  --    < > = values in this interval not displayed.    Estimated Creatinine Clearance: 126.6 mL/min (by C-G formula based on SCr of 1.02 mg/dL).  Assessment: 45 y.o. M presents with pancreatitis. Pt was at [facility] from 12/[facility]-12/7 but left AMA and came to [facility] ED.  Pt on  heparin for SMA thrombus (nonocclusive). Pt was on enoxaparin 40mg  daily for VTE prophylaxis at OSH - last dose 12/6 1700. 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    Heparin level is therapeutic (0.39) on 2400  units/hr. First therapeutic level after 3 rate increases. No bleeding reported.  Goal of Therapy:  Heparin level 0.3-0.7 units/ml Monitor platelets by anticoagulation protocol: Yes   Plan:  Continue heparin drip at 2400 units/hr. Heparin level this afternoon to be sure it remains in target range. Daily heparin level and CBC while on heparin.  14/6, RPh 01/01/2021,9:43 AM  Addendum:   To transition to Eliquis for discharge.  Eliquis 10 mg BID x 1 week, then 5 mg BID.  IV heparin to stop when giving first Eliquis dose.  Discussed with patient.  Dennie Fetters, RPh 01/01/2021 2:26 PM

## 2021-01-05 LAB — CULTURE, BLOOD (ROUTINE X 2)
Culture: NO GROWTH
Culture: NO GROWTH
Special Requests: ADEQUATE
Special Requests: ADEQUATE

## 2021-01-15 ENCOUNTER — Other Ambulatory Visit (HOSPITAL_COMMUNITY): Payer: Self-pay

## 2021-01-15 ENCOUNTER — Telehealth (HOSPITAL_COMMUNITY): Payer: Self-pay

## 2021-01-15 NOTE — Telephone Encounter (Signed)
Transitions of Care Pharmacy   Call attempted for a pharmacy transitions of care follow-up. Unable to leave voicemail.  Call attempt #1. Will follow-up in 2-3 days.    

## 2021-01-19 ENCOUNTER — Telehealth (HOSPITAL_COMMUNITY): Payer: Self-pay | Admitting: Pharmacist

## 2021-01-19 ENCOUNTER — Other Ambulatory Visit (HOSPITAL_COMMUNITY): Payer: Self-pay

## 2021-01-19 NOTE — Telephone Encounter (Signed)
Transitions of Care Pharmacy   Call attempted for a pharmacy transitions of care follow-up. No voicemail available.   Call attempt #2. Will follow-up in 2-3 days.

## 2021-01-20 ENCOUNTER — Telehealth (HOSPITAL_COMMUNITY): Payer: Self-pay | Admitting: Pharmacy Technician

## 2021-01-20 ENCOUNTER — Other Ambulatory Visit (HOSPITAL_COMMUNITY): Payer: Self-pay

## 2021-01-20 NOTE — Telephone Encounter (Signed)
Transitions of Care Pharmacy   Call attempted for a pharmacy transitions of care follow-up. Third call attempt, no voicemail set up. Will discontinue follow up.

## 2022-04-02 ENCOUNTER — Emergency Department (HOSPITAL_COMMUNITY): Payer: BC Managed Care – PPO

## 2022-04-02 ENCOUNTER — Emergency Department (HOSPITAL_BASED_OUTPATIENT_CLINIC_OR_DEPARTMENT_OTHER)
Admission: EM | Admit: 2022-04-02 | Discharge: 2022-04-02 | Disposition: A | Discharge status: Home / Self Care | Payer: BC Managed Care – PPO | Attending: Emergency Medicine | Admitting: Emergency Medicine

## 2022-04-02 ENCOUNTER — Encounter (HOSPITAL_BASED_OUTPATIENT_CLINIC_OR_DEPARTMENT_OTHER): Payer: Self-pay | Admitting: Emergency Medicine

## 2022-04-02 ENCOUNTER — Other Ambulatory Visit: Payer: Self-pay

## 2022-04-02 DIAGNOSIS — Z7984 Long term (current) use of oral hypoglycemic drugs: Secondary | ICD-10-CM | POA: Diagnosis not present

## 2022-04-02 DIAGNOSIS — Z7982 Long term (current) use of aspirin: Secondary | ICD-10-CM | POA: Insufficient documentation

## 2022-04-02 DIAGNOSIS — M67834 Other specified disorders of tendon, left wrist: Secondary | ICD-10-CM | POA: Insufficient documentation

## 2022-04-02 DIAGNOSIS — E119 Type 2 diabetes mellitus without complications: Secondary | ICD-10-CM | POA: Diagnosis not present

## 2022-04-02 DIAGNOSIS — M779 Enthesopathy, unspecified: Secondary | ICD-10-CM

## 2022-04-02 DIAGNOSIS — M67833 Other specified disorders of tendon, right wrist: Secondary | ICD-10-CM | POA: Insufficient documentation

## 2022-04-02 DIAGNOSIS — M25532 Pain in left wrist: Secondary | ICD-10-CM | POA: Diagnosis present

## 2022-04-02 DIAGNOSIS — I10 Essential (primary) hypertension: Secondary | ICD-10-CM | POA: Insufficient documentation

## 2022-04-02 DIAGNOSIS — Z7901 Long term (current) use of anticoagulants: Secondary | ICD-10-CM | POA: Diagnosis not present

## 2022-04-02 DIAGNOSIS — Z79899 Other long term (current) drug therapy: Secondary | ICD-10-CM | POA: Insufficient documentation

## 2022-04-02 DIAGNOSIS — M25531 Pain in right wrist: Secondary | ICD-10-CM

## 2022-04-02 HISTORY — DX: Enterocolitis due to Clostridium difficile, not specified as recurrent: A04.72

## 2022-04-02 HISTORY — DX: Acute pancreatitis without necrosis or infection, unspecified: K85.90

## 2022-04-02 LAB — CBC WITH DIFFERENTIAL/PLATELET
Abs Immature Granulocytes: 0.01 10*3/uL (ref 0.00–0.07)
Basophils Absolute: 0 10*3/uL (ref 0.0–0.1)
Basophils Relative: 1 %
Eosinophils Absolute: 0.1 10*3/uL (ref 0.0–0.5)
Eosinophils Relative: 1 %
HCT: 48 % (ref 39.0–52.0)
Hemoglobin: 16 g/dL (ref 13.0–17.0)
Immature Granulocytes: 0 %
Lymphocytes Relative: 33 %
Lymphs Abs: 2.1 10*3/uL (ref 0.7–4.0)
MCH: 30.3 pg (ref 26.0–34.0)
MCHC: 33.3 g/dL (ref 30.0–36.0)
MCV: 90.9 fL (ref 80.0–100.0)
Monocytes Absolute: 0.4 10*3/uL (ref 0.1–1.0)
Monocytes Relative: 7 %
Neutro Abs: 3.7 10*3/uL (ref 1.7–7.7)
Neutrophils Relative %: 58 %
Platelets: 180 10*3/uL (ref 150–400)
RBC: 5.28 MIL/uL (ref 4.22–5.81)
RDW: 12.8 % (ref 11.5–15.5)
WBC: 6.3 10*3/uL (ref 4.0–10.5)
nRBC: 0 % (ref 0.0–0.2)

## 2022-04-02 LAB — C-REACTIVE PROTEIN: CRP: 0.5 mg/dL (ref <1.0)

## 2022-04-02 LAB — COMPREHENSIVE METABOLIC PANEL
ALT: 35 U/L (ref 0–44)
AST: 45 U/L — ABNORMAL HIGH (ref 15–41)
Albumin: 4.3 g/dL (ref 3.5–5.0)
Alkaline Phosphatase: 59 U/L (ref 38–126)
Anion gap: 8 (ref 5–15)
BUN: 13 mg/dL (ref 6–20)
CO2: 29 mmol/L (ref 22–32)
Calcium: 9.2 mg/dL (ref 8.9–10.3)
Chloride: 101 mmol/L (ref 98–111)
Creatinine, Ser: 1.02 mg/dL (ref 0.61–1.24)
GFR, Estimated: >60 mL/min (ref >60)
Glucose, Bld: 154 mg/dL — ABNORMAL HIGH (ref 70–99)
Potassium: 4 mmol/L (ref 3.5–5.1)
Sodium: 138 mmol/L (ref 135–145)
Total Bilirubin: 1.1 mg/dL (ref 0.3–1.2)
Total Protein: 6.7 g/dL (ref 6.5–8.1)

## 2022-04-02 LAB — LACTIC ACID, PLASMA: Lactic Acid, Venous: 1 mmol/L (ref 0.5–1.9)

## 2022-04-02 LAB — SEDIMENTATION RATE: Sed Rate: 2 mm/hr (ref 0–16)

## 2022-04-02 LAB — CK: Total CK: 1065 U/L — ABNORMAL HIGH (ref 49–397)

## 2022-04-02 MED ORDER — PREDNISONE 10 MG (21) PO TBPK: ORAL_TABLET | Freq: Every day | ORAL | 0 refills | Status: AC

## 2022-04-02 MED ORDER — KETOROLAC TROMETHAMINE 60 MG/2ML IM SOLN
60.0000 mg | Freq: Once | INTRAMUSCULAR | Status: AC
  Administered 2022-04-02: 60 mg via INTRAMUSCULAR
  Filled 2022-04-02: qty 2

## 2022-04-02 MED ORDER — ASPIRIN 81 MG PO TBEC: 81.0000 mg | DELAYED_RELEASE_TABLET | Freq: Every day | ORAL | 0 refills | Status: AC

## 2022-04-02 NOTE — ED Notes (Signed)
Pt transfer ED to [facility]. Report to [facility], accepting [employer].  Discharge instructions have been discussed with patient and/or family members. Pt verbally acknowledges understanding d/c instructions, and endorses comprehension to checkout at registration before leaving and to report to [facility] ED

## 2022-04-02 NOTE — ED Triage Notes (Signed)
Patient c/o pain and swelling to bilateral wrist onset yesterday. 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

## 2022-04-02 NOTE — Discharge Instructions (Addendum)
You are seen today in the emergency department for wrist pain.  Take the steroid taper as prescribed, and continue taking a baby aspirin 81 mg daily.  See Dr. Fredna Dow the office this week for reevaluation, wear the thumb spica splint splint as needed.  Check your sugars at home if able, steroids can cause hyperglycemia and if you start having concerning symptoms such as intractable nausea vomiting, severe abdominal pain, concerning symptoms Stop the steroid and be seen in the department.

## 2022-04-02 NOTE — ED Provider Notes (Signed)
Patient transferred in [facility] med center for bilateral wrist x-rays and hand surgery evaluation.  Complete history copied from previous provider note:    47yo male with history of DM, hypertension, hyperlipidemia, pancreatitis, PE no longer on eliquis who presents with concern for wrist pain.    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  Reports he worked 19 hours straight in the freezer on Thursday and began having pain as the day went on. Had pain Thursday night, took 4 tylenol then woke up 4 hours latera and again worked from [employer] to [employer] in the freezer. Reports his hand and wrist were hurting so much, particularly on the right that he had to support it with his other hand.  He was wearing a loose fitting dry fit shirt and gloves and reports whne he pulled his shirt back his arm was white in color.  He has now had increasing pain, worse on the right. 10/10 severe pain, feels like burning, can barely move his hand particularly on the right.  Cannot get himself dressed. Has swelling right worse than left. His fingertips feel like sandpaper bilaterally.  No fever. Denies IVDU.     Physical Exam  BP 114/82   Pulse 71   Temp 98.1 F (36.7 C) (Oral)   Resp 18   Ht '6\' 4"'$  (1.93 m)   Wt 98 kg   SpO2 93%   BMI 26.29 kg/m   Physical Exam Vitals and nursing note reviewed. Exam conducted with a chaperone present.  Constitutional:      General: He is not in acute distress.    Appearance: Normal appearance.  HENT:     Head: Normocephalic and atraumatic.  Eyes:     General: No scleral icterus.    Extraocular Movements: Extraocular movements intact.     Pupils: Pupils are equal, round, and reactive to light.  Cardiovascular:     Pulses: Normal pulses.  Musculoskeletal:        General: Tenderness present.  Skin:    Capillary Refill: Capillary refill takes less than 2 seconds.     Coloration: Skin is not jaundiced.   Neurological:     Mental Status: He is alert. Mental status is at baseline.     Coordination: Coordination normal.     Procedures  Procedures  ED Course / MDM    Medical Decision Making Amount and/or Complexity of Data Reviewed Labs: ordered. Radiology: ordered.  Risk OTC drugs. Prescription drug management.   Patient presents to the emergency department for bilateral wrist pain.  Laboratory workup was notable for elevated CK, ESR and CRP are within normal limits.  No leukocytosis, no gross electrolyte derangement or AKI.  Bilateral x-rays wrist show soft tissue swelling but no acute fractures.  Dr. Fredna Dow with orthopedic hand surgery evaluated the patient, suspects radial neuropathy versus cold injury versus de Quervain's.  Recommends baby aspirin, steroid taper and he will see the patient in the office this week.  Also requesting thumb spica's bilaterally.  Appreciate his consult.  Plan discussed with the patient, he is agreeable to plan.  Stable for outpatient follow-up.  No sign of cellulitis, compartment syndrome, also not consistent with clot based on evaluation.  Do not suspect septic joint.       Sherrill Raring, PA-C 04/02/22 [place]    Varney Biles, MD 04/03/22 1843

## 2022-04-02 NOTE — Consult Note (Signed)
Dakota Compton is an 47 y.o. male.   Chief Complaint: Hand pain HPI: 47 year old male with bilateral hand pain right greater than left.  He states this started 2 days ago.  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  He states he did not take much break.  He noted at 1 point yesterday he pulled his sleeve back and saw whiteness to the skin at the radial side of his right wrist.  He states it appeared broken.  He noted pain overnight.  He presented [facility] earlier today and was transferred to [facility] for further evaluation.  He states the left side is bothersome as well but not as much.  His fingertips on the right hand feel like sandpaper and can be more painful with use.  He has a tight feeling in his wrist.  Allergies:  Allergies  Allergen Reactions   Morphine And Related Shortness Of Breath   Penicillins Anaphylaxis, Hives and Swelling    Tolerates carbapenems Did it involve swelling of the face/tongue/throat, SOB, or low BP? Yes Did it involve sudden or severe rash/hives, skin peeling, or any reaction on the inside of your mouth or nose? Yes Did you need to seek medical attention at a hospital or doctor's office? Yes When did it last happen? childhood      If all above answers are "NO", may proceed with cephalosporin use.    Semaglutide     Other reaction(s): Chest Pain    Past Medical History:  Diagnosis Date   C. difficile colitis    Diabetes mellitus without complication ([place])    DVT (deep venous thrombosis) (HCC)    GERD (gastroesophageal reflux disease)    GERD (gastroesophageal reflux disease)    Hypercholesterolemia    Hypertension    Pancreatitis    Pulmonary emboli (HCC)     Past Surgical History:  Procedure Laterality Date    CHOLECYSTECTOMY     URETHRA SURGERY      Family History: Family History  Problem Relation Age of Onset   Clotting disorder Mother     Social History:   reports that he has never smoked. He has never used smokeless tobacco. He reports that he does not drink alcohol and does not use drugs.  Medications: (Not in a hospital admission)   Results for orders placed or performed during the hospital encounter of 04/02/22 (from the past 48 hour(s))  CBC with Differential     Status: None   Collection Time: 04/02/22  1:12 PM  Result Value Ref Range   WBC 6.3 4.0 - 10.5 K/uL   RBC 5.28 4.22 - 5.81 MIL/uL   Hemoglobin 16.0 13.0 - 17.0 g/dL   HCT 48.0 39.0 - 52.0 %   MCV 90.9 80.0 - 100.0 fL   MCH 30.3 26.0 - 34.0 pg   MCHC 33.3 30.0 - 36.0 g/dL   RDW 12.8 11.5 - 15.5 %   Platelets 180 150 - 400 K/uL   nRBC 0.0 0.0 - 0.2 %   Neutrophils Relative % 58 %   Neutro Abs 3.7 1.7 - 7.7 K/uL   Lymphocytes Relative 33 %   Lymphs Abs 2.1 0.7 - 4.0 K/uL   Monocytes Relative 7 %  Monocytes Absolute 0.4 0.1 - 1.0 K/uL   Eosinophils Relative 1 %   Eosinophils Absolute 0.1 0.0 - 0.5 K/uL   Basophils Relative 1 %   Basophils Absolute 0.0 0.0 - 0.1 K/uL   Immature Granulocytes 0 %   Abs Immature Granulocytes 0.01 0.00 - 0.07 K/uL    Comment: Performed at [facility] [address] [place], [place] [address]  Comprehensive metabolic panel     Status: Abnormal   Collection Time: 04/02/22  1:12 PM  Result Value Ref Range   Sodium 138 135 - 145 mmol/L   Potassium 4.0 3.5 - 5.1 mmol/L   Chloride 101 98 - 111 mmol/L   CO2 29 22 - 32 mmol/L   Glucose, Bld 154 (H) 70 - 99 mg/dL    Comment: Glucose reference range applies only to samples taken after fasting for at least 8 hours.   BUN 13 6 - 20 mg/dL   Creatinine, Ser 1.02 0.61 - 1.24 mg/dL   Calcium 9.2 8.9 - 10.3 mg/dL   Total Protein 6.7 6.5 - 8.1 g/dL   Albumin 4.3 3.5 - 5.0 g/dL   AST 45 (H) 15 - 41 U/L   ALT 35 0 - 44 U/L    Alkaline Phosphatase 59 38 - 126 U/L   Total Bilirubin 1.1 0.3 - 1.2 mg/dL   GFR, Estimated >60 >60 mL/min    Comment: (NOTE) Calculated using the CKD-EPI Creatinine Equation (2021)    Anion gap 8 5 - 15    Comment: Performed at [facility] [address], [place], [place] [address]  Sedimentation rate     Status: None   Collection Time: 04/02/22  1:12 PM  Result Value Ref Range   Sed Rate 2 0 - 16 mm/hr    Comment: Performed at [facility] [address] [place], [place] [address]  C-reactive protein     Status: None   Collection Time: 04/02/22  1:12 PM  Result Value Ref Range   CRP 0.5 <1.0 mg/dL    Comment: Performed at [facility] [address], [place], [place] [address]  CK     Status: Abnormal   Collection Time: 04/02/22  1:12 PM  Result Value Ref Range   Total CK 1,065 (H) 49 - 397 U/L    Comment: Performed at [facility], [place] [address] [place], [place] [address]  Lactic acid, plasma     Status: None   Collection Time: 04/02/22  1:29 PM  Result Value Ref Range   Lactic Acid, Venous 1.0 0.5 - 1.9 mmol/L    Comment: Performed at [facility] [address] [place], [place] [address]    DG Wrist Complete Left  Result Date: 04/02/2022 CLINICAL DATA:  Pain EXAM: LEFT WRIST - COMPLETE 3+ VIEW COMPARISON:  09/26/2005 FINDINGS: There is no evidence of fracture or dislocation. There is no evidence of arthropathy or other focal bone abnormality. Mild generalized soft tissue swelling of the wrist and distal forearm. IMPRESSION: Mild generalized soft tissue swelling of the wrist and distal forearm. No acute osseous abnormality. Electronically Signed   By: Davina Poke D.O.   On: 04/02/2022 13:31   DG Wrist Complete Right  Result Date: 04/02/2022 CLINICAL DATA:  Pain EXAM: RIGHT WRIST - COMPLETE 3+ VIEW COMPARISON:  None Available. FINDINGS: There is no evidence of acute fracture or dislocation. There is no evidence of arthropathy or other  focal bone  abnormality. Generalized soft tissue swelling of the wrist and forearm. IMPRESSION: 1. No acute fracture or dislocation. 2. Generalized soft tissue swelling of the wrist and forearm. Electronically Signed   By: Davina Poke D.O.   On: 04/02/2022 13:30      Blood pressure 114/82, pulse 71, temperature 98.1 F (36.7 C), temperature source Oral, resp. rate 18, height '6\' 4"'$  (1.93 m), weight 98 kg, SpO2 93 %.  General appearance: alert, cooperative, and appears stated age Head: Normocephalic, without obvious abnormality, atraumatic Neck: supple, symmetrical, trachea midline Extremities: Intact sensation and capillary refill all digits.  He states the pads of the fingers and the right hand feels more like sandpaper.  All of the fingers feel the same.  The index and thumb feel the same as the small finger.  He notes normal sensation on the dorsum of the hand.  +epl/fpl/io.  No wounds.  He can make nearly a complete fist.  Passive motion of the fingers and wrist does not elicit significant pain.  At the radial side of the wrist and distal forearm there is a light rubor.  He states this is in the same area as the white appearance to the skin when he looked at it at work.  No proximal streaking.  He is tender over the first dorsal compartment.  Positive Finkelstein's.  He has pain with resisted extension of the thumb and has difficulty extending the thumb secondary to discomfort.  There is some swelling over the first dorsal compartment as well.  Mildly tender over the fourth dorsal compartment though not as much.  Compartments are soft in both forearms.  He is soft over the flexor tendons and dorsum of the wrist at the level of the wrist and distal forearm.  The left hand has nearly full range of motion and minimal tenderness to palpation.  Finkelstein's is mildly positive. Pulses: 2+ and symmetric Skin: Skin color, texture, turgor normal. No rashes or lesions Neurologic: Grossly  normal Incision/Wound: none  Assessment/Plan I think this represents a thermal injury mostly to the right hand and wrist.  He may also have de Quervain's tenosynovitis more so on the right than the left.  I do not suspect compartment syndrome as his compartments are soft and the mechanism is not typical for such.  His labs overall are within normal limits.  His CK is elevated.  This was discussed with the Sherrill Raring, PA-C.  I discussed with the patient that we will have to keep an eye on the tissues at the radial side of the right wrist and forearm.  I think these will likely survive but is always possible there could be tissue necrosis depending on how much damage was caused due to his thermal injury.  Will get him thumb spica splints for comfort as well as a steroid course.  Will also start aspirin for trying to maintain appropriate blood flow.  Will keep him off work for now and I will check him back likely later this week in the office.  He is comfortable with the plan of care.  Leanora Cover 04/02/2022, 3:41 PM

## 2022-04-02 NOTE — ED Provider Notes (Signed)
[facility] Provider Note   CSN: [id number] Arrival date & time: 04/02/22  1011     History  Chief Complaint  Patient presents with   Wrist Pain    Dakota Compton is a 47 y.o. male.  HPI      47yo male with history of DM, hypertension, hyperlipidemia, pancreatitis, PE no longer on eliquis who presents with concern for wrist pain.   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  Reports he worked 19 hours straight in the freezer on Thursday and began having pain as the day went on. Had pain Thursday night, took 4 tylenol then woke up 4 hours latera and again worked from [employer] to [employer] in the freezer. Reports his hand and wrist were hurting so much, particularly on the right that he had to support it with his other hand.  He was wearing a loose fitting dry fit shirt and gloves and reports whne he pulled his shirt back his arm was white in color.  He has now had increasing pain, worse on the right. 10/10 severe pain, feels like burning, can barely move his hand particularly on the right.  Cannot get himself dressed. Has swelling right worse than left. His fingertips feel like sandpaper bilaterally.  No fever. Denies IVDU.   Past Medical History:  Diagnosis Date   C. difficile colitis    Diabetes mellitus without complication ([place])    DVT (deep venous thrombosis) (HCC)    GERD (gastroesophageal reflux disease)    GERD (gastroesophageal reflux disease)    Hypercholesterolemia    Hypertension    Pancreatitis    Pulmonary emboli (HCC)     Home Medications Prior to Admission medications   Medication Sig Start Date End Date Taking? Authorizing Provider  acetaminophen (TYLENOL) 500 MG tablet Take 1,000 mg by mouth every 6 (six) hours as needed for moderate pain or headache.    [provider]  APIXABAN Arne Cleveland) VTE STARTER PACK ('10MG'$  AND '5MG'$ ) Take as directed on package:  start with two-'5mg'$  tablets twice daily for 7 days. On day 8, switch to one-'5mg'$  tablet twice daily. 01/01/21   Florencia Reasons, MD  APPLE CIDER VINEGAR PO Take 2 capsules by mouth daily.    [provider]  aspirin 81 MG EC tablet Take 81 mg by mouth daily.    [provider]  BIOTIN PO Take 1 tablet by mouth daily.    [provider]  cholecalciferol (VITAMIN D) 25 MCG (1000 UT) tablet Take 1,000 Units by mouth daily.    [provider]  CINNAMON PO Take 1 capsule by mouth daily.    [provider]  ELDERBERRY PO Take 2 tablets by mouth daily.    [provider]  gabapentin (NEURONTIN) 100 MG capsule Take 100 mg by mouth at bedtime. 12/09/20   [provider]  JARDIANCE 10 MG TABS tablet Take 10 mg by mouth daily. 12/26/20   [provider]  lisinopril (ZESTRIL) 20 MG tablet Take 1 tablet (20 mg total) by mouth daily. Hold for three days, then resume  Please check your blood pressure at home, please hold lisinopril and  call your doctor if your systolic blood pressure is less than 100. 01/01/21   Florencia Reasons, MD  metFORMIN (GLUCOPHAGE) 500 MG tablet Take 500 mg by mouth 2 (two) times daily. 07/17/20   [provider]  Misc  Natural Products (TURMERIC CURCUMIN) CAPS Take 1 capsule by mouth daily.    [provider]  Multiple Vitamin (MULTIVITAMIN WITH MINERALS) TABS tablet Take 1 tablet by mouth daily.    [provider]  Probiotic Product (PROBIOTIC DAILY PO) Take 1 capsule by mouth daily.    [provider]  rosuvastatin (CRESTOR) 10 MG tablet Take 1 tablet (10 mg total) by mouth at bedtime. Please hold crestor for a week, resume when ok with your pcp 01/01/21   Florencia Reasons, MD      Allergies    Morphine and related, Penicillins, and Semaglutide    Review of Systems   Review of Systems  Physical Exam Updated Vital Signs BP 117/74 (BP Location: Left Arm)   Pulse 60   Temp 98.1 F (36.7 C) (Oral)    Resp 18   Ht '6\' 4"'$  (1.93 m)   Wt 98 kg   SpO2 100%   BMI 26.29 kg/m  Physical Exam Vitals and nursing note reviewed.  Constitutional:      General: He is not in acute distress.    Appearance: Normal appearance. He is not ill-appearing, toxic-appearing or diaphoretic.  HENT:     Head: Normocephalic.  Eyes:     Conjunctiva/sclera: Conjunctivae normal.  Cardiovascular:     Rate and Rhythm: Normal rate and regular rhythm.     Pulses: Normal pulses.  Pulmonary:     Effort: Pulmonary effort is normal. No respiratory distress.  Musculoskeletal:        General: Swelling and tenderness present. No deformity or signs of injury.     Cervical back: No rigidity.     Comments: Cannot flex wrist, difficulty with finger abduction, can do opponens Cannot make full fist with hand, can extend fingers   Radial pulses bilaterally Cap refill 2s Reports altered sensation thumb Reports finger tips feel like pain and sandpaper with pressure bilaterally  Skin:    General: Skin is warm and dry.     Coloration: Skin is not jaundiced or pale.  Neurological:     General: No focal deficit present.     Mental Status: He is alert and oriented to person, place, and time.     ED Results / Procedures / Treatments   Labs (all labs ordered are listed, but only abnormal results are displayed) Labs Reviewed  CBC WITH DIFFERENTIAL/PLATELET  COMPREHENSIVE METABOLIC PANEL  SEDIMENTATION RATE  C-REACTIVE PROTEIN  CK  LACTIC ACID, PLASMA  LACTIC ACID, PLASMA    EKG None  Radiology No results found.  Procedures Procedures    Medications Ordered in ED Medications  ketorolac (TORADOL) injection 60 mg (60 mg Intramuscular Given 04/02/22 1221)    ED Course/ Medical Decision Making/ A&P                               47yo male with history of DM, hypertension, hyperlipidemia, pancreatitis, PE no longer on eliquis who presents with concern for wrist pain.   No fever, denies IVDU, erythema overall  does not appear cellulitic and feel infection/septic arthritis/necrotizing fasciitis unlikely.  Area of swelling is involving hand/wrist of area of significant use without other arm swelling and acute DVT seems unlikely.   Suspect tendonitis/overuse and possible cold injury, ?reperfusion given history of color changes in cold/?DM related microvascular disease.    Given his changed function of hand, significant pain, swelling consulted Dr. Fredna Dow.  Recommends labs/imaging.  He will be  transferred to [place] for evaluation by Dr. Fredna Dow. We are grateful for his expertise regarding this patient.  Labs and imaging may be obtained at [facility].         Final Clinical Impression(s) / ED Diagnoses Final diagnoses:  Bilateral wrist pain  Tendonitis    Rx / DC Orders ED Discharge Orders     None         Gareth Morgan, MD 04/02/22 1244

## 2022-07-09 IMAGING — CT CT ABD-PELV W/ CM
2 of 4 series · 16 of 46 positions shown, 18 images · IV contrast (APPLIED)
Comparison: CT abdomen and pelvis 12/29/2020.

CLINICAL DATA: Acute abdominal pain.  Diarrhea and pancreatitis.

EXAM:
CT ABDOMEN AND PELVIS WITH CONTRAST
TECHNIQUE: Multidetector CT imaging of the abdomen and pelvis was performed
using the standard protocol following bolus administration of
intravenous contrast.
CONTRAST:  100mL OMNIPAQUE IOHEXOL 300 MG/ML  SOLN

[Series 3: abd/ pelvis 5.0 i30f 2 · axial · 0.92mm/px · z∈[+738,+1253]mm · 13 of 115 slices shown, 15 images]
[im 6/115  soft-tissue]
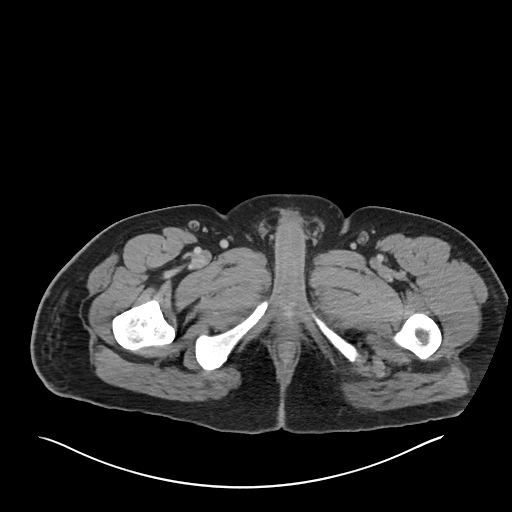
[im 6/115  bone]
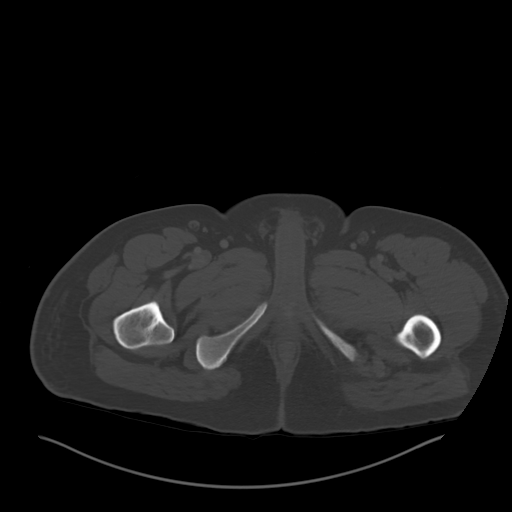
[im 16/115  soft-tissue]
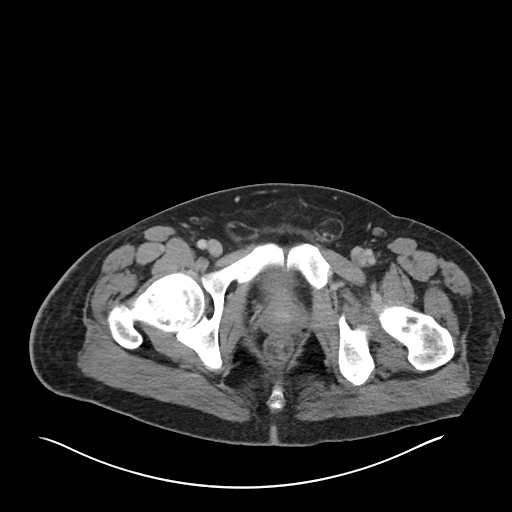
[im 26/115  soft-tissue]
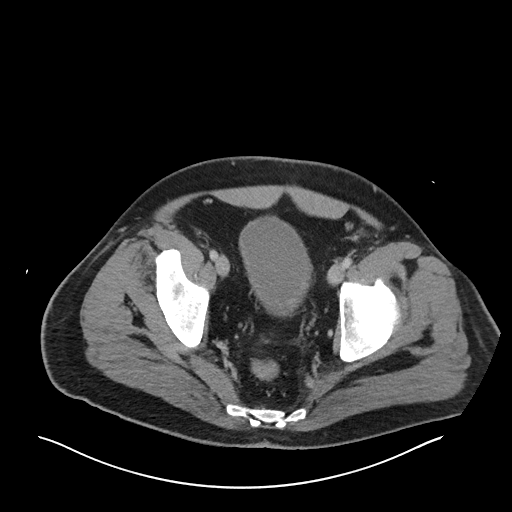
[im 32/115  soft-tissue]
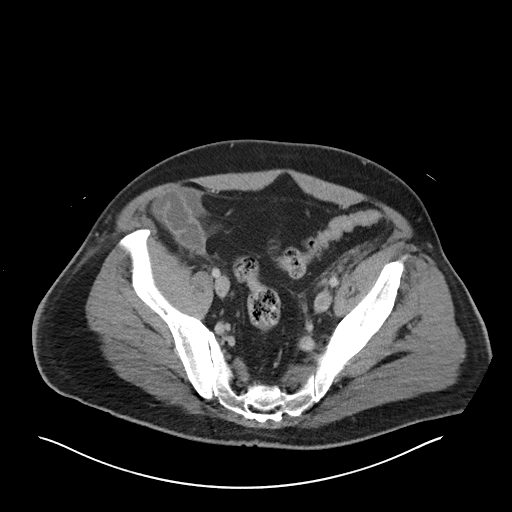
[im 42/115  soft-tissue]
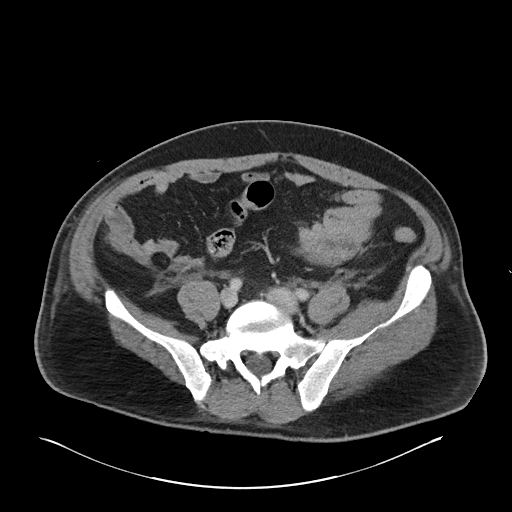
[im 47/115  soft-tissue]
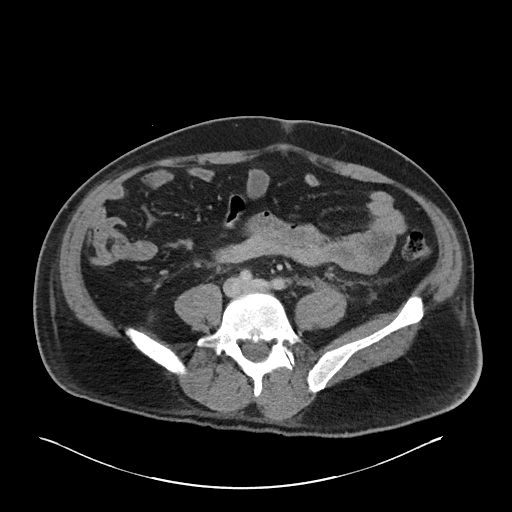
[im 58/115  soft-tissue]
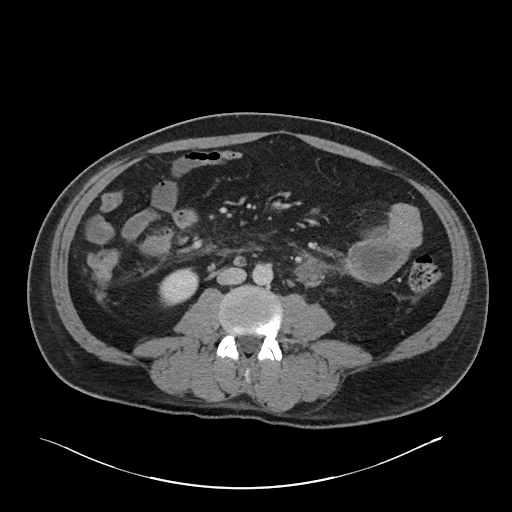
[im 68/115  soft-tissue]
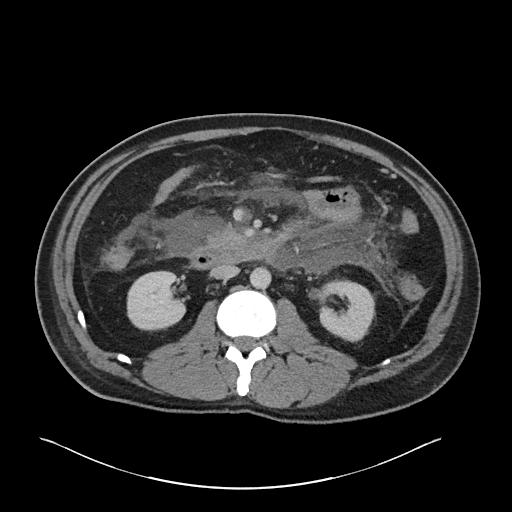
[im 73/115  soft-tissue]
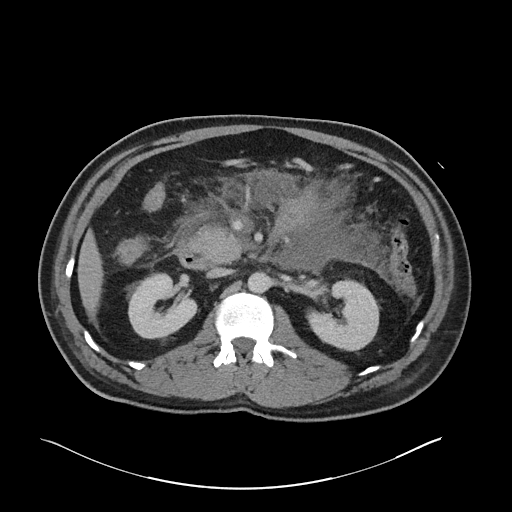
[im 73/115  bone]
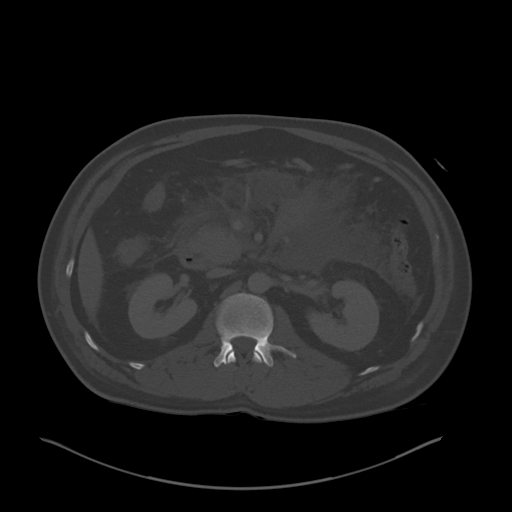
[im 83/115  soft-tissue]
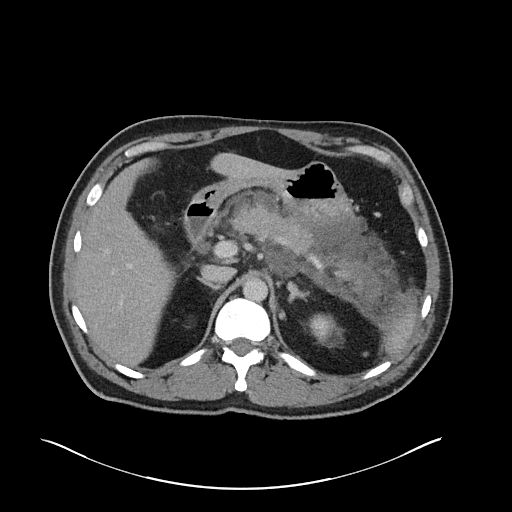
[im 89/115  soft-tissue]
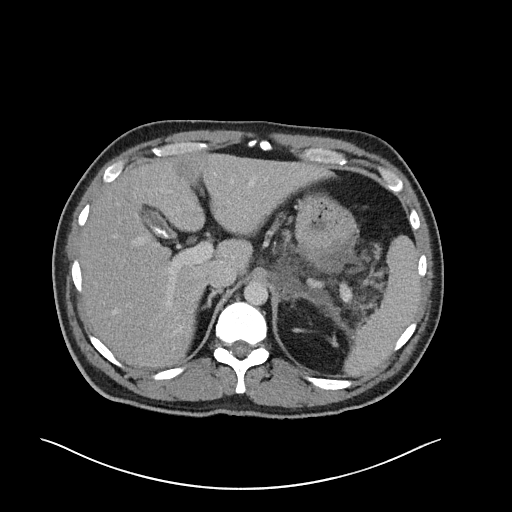
[im 99/115  soft-tissue]
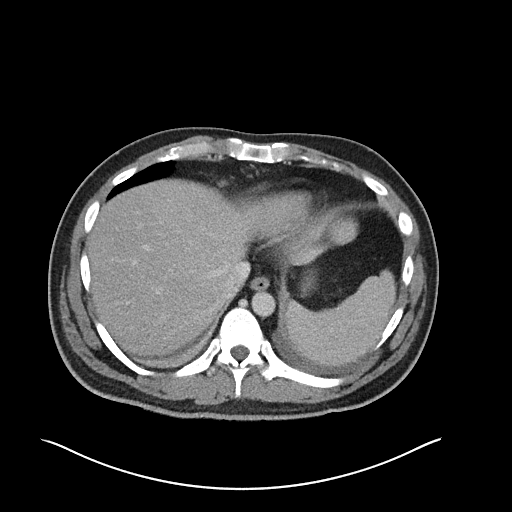
[im 109/115  soft-tissue]
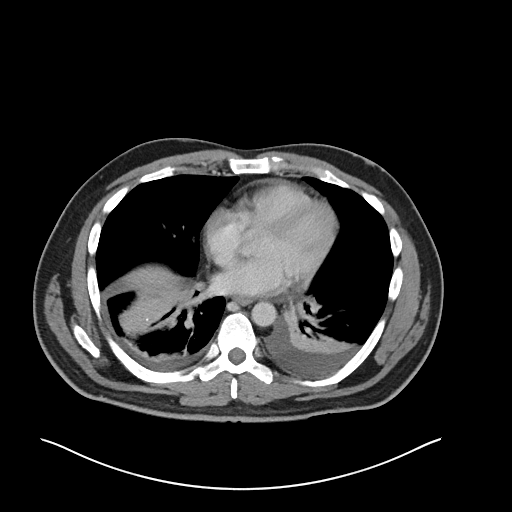

[Series 6: coronal soft tissue · coronal · 1.07mm/px · 3 of 117 slices shown]
[im 39/117  soft-tissue]
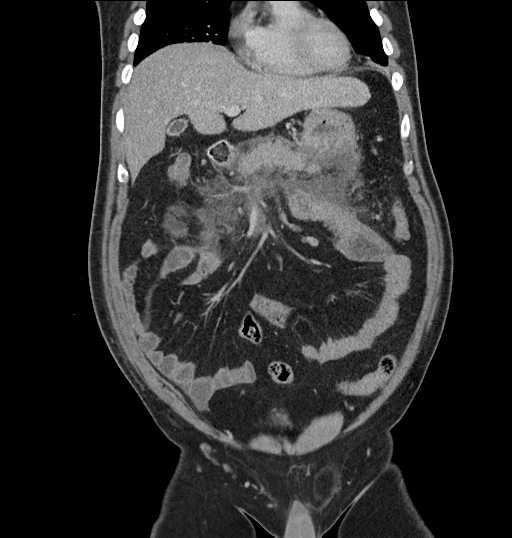
[im 52/117  soft-tissue]
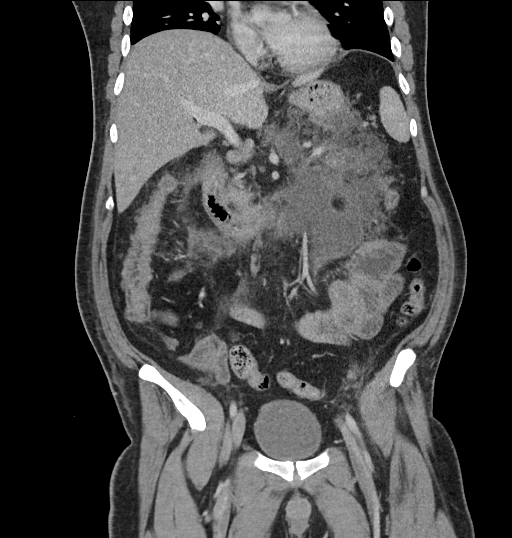
[im 65/117  soft-tissue]
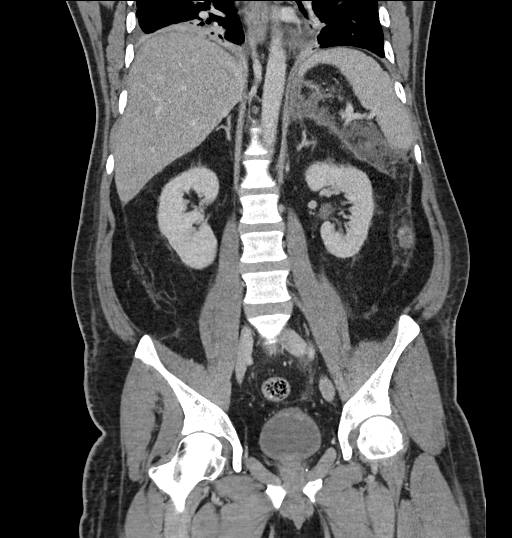

[16 of 46 positions shown; findings below may reference images not displayed]

FINDINGS: Lower chest: There are small bilateral pleural effusions with
atelectasis/airspace disease in the bilateral lower lobes, slightly
decreased on the right.

Hepatobiliary: Gallstones are again seen. There is no biliary ductal
dilatation. Liver is within normal limits.

Pancreas: Marked peripancreatic inflammation and fluid appear
similar to the prior examination compatible with acute pancreatitis.
There is lack of enhancement of the pancreatic tail and portion of
the body compatible with pancreatic necrosis, unchanged. There is no
pancreatic ductal dilatation.

Spleen: Normal in size without focal abnormality.

Adrenals/Urinary Tract: There is a stable 2.1 cm superior pole cyst.
Otherwise, the kidneys, adrenal glands and bladder are within normal
limits.

Stomach/Bowel: Stomach is within normal limits. Appendix appears
normal. No evidence of bowel wall thickening, distention, or
inflammatory changes.

Vascular/Lymphatic: Aorta and IVC are normal in size. Nonocclusive
thrombus in the superior mesenteric vein appears unchanged from the
prior study. No enlarged lymph nodes.

Reproductive: Prostate is unremarkable.

Other: Fluid collection adjacent to the greater curvature of the
stomach appears unchanged measuring 1.8 x 3.9 cm image [DATE]. There
is trace free fluid in the right lower quadrant, also unchanged.
There are small fat containing bilateral inguinal hernias.

Musculoskeletal: No acute or significant osseous findings.
IMPRESSION: 1. No significant interval change in moderate pancreatitis with
necrosis involving the body and tail.
2. Unchanged nonocclusive thrombus in the superior mesenteric vein.
3. Unchanged fluid collection adjacent to the greater curvature of
the stomach with small amount of free fluid in the right lower
quadrant.
4. Cholelithiasis.
5. Small bilateral pleural effusions and bilateral lower lobe
atelectasis/airspace disease has decreased on the right.
